# Patient Record
Sex: Female | Born: 1985 | Hispanic: Yes | Marital: Single | Smoking: Never smoker
Health system: Southern US, Community
[De-identification: ages and names within clinical notes are randomized; demographics above are authoritative.]

## PROBLEM LIST (undated history)

## (undated) DIAGNOSIS — K2970 Gastritis, unspecified, without bleeding: Secondary | ICD-10-CM

## (undated) DIAGNOSIS — O24419 Gestational diabetes mellitus in pregnancy, unspecified control: Secondary | ICD-10-CM

## (undated) DIAGNOSIS — D649 Anemia, unspecified: Secondary | ICD-10-CM

## (undated) DIAGNOSIS — R519 Headache, unspecified: Secondary | ICD-10-CM

## (undated) HISTORY — PX: NO PAST SURGERIES: SHX2092

## (undated) HISTORY — DX: Gastritis, unspecified, without bleeding: K29.70

## (undated) HISTORY — DX: Headache, unspecified: R51.9

---

## 2017-04-17 ENCOUNTER — Ambulatory Visit: Payer: Self-pay | Admitting: Family Medicine

## 2017-04-24 ENCOUNTER — Ambulatory Visit: Payer: Self-pay | Admitting: Urgent Care

## 2018-01-08 ENCOUNTER — Emergency Department (HOSPITAL_COMMUNITY): Payer: Self-pay

## 2018-01-08 ENCOUNTER — Emergency Department (HOSPITAL_COMMUNITY)
Admission: EM | Admit: 2018-01-08 | Discharge: 2018-01-08 | Disposition: A | Discharge status: Home / Self Care | Payer: Self-pay | Attending: Emergency Medicine | Admitting: Emergency Medicine

## 2018-01-08 ENCOUNTER — Encounter (HOSPITAL_COMMUNITY): Payer: Self-pay

## 2018-01-08 ENCOUNTER — Other Ambulatory Visit: Payer: Self-pay

## 2018-01-08 DIAGNOSIS — J322 Chronic ethmoidal sinusitis: Secondary | ICD-10-CM

## 2018-01-08 DIAGNOSIS — J0120 Acute ethmoidal sinusitis, unspecified: Secondary | ICD-10-CM | POA: Insufficient documentation

## 2018-01-08 DIAGNOSIS — Z79899 Other long term (current) drug therapy: Secondary | ICD-10-CM | POA: Insufficient documentation

## 2018-01-08 DIAGNOSIS — K219 Gastro-esophageal reflux disease without esophagitis: Secondary | ICD-10-CM | POA: Insufficient documentation

## 2018-01-08 HISTORY — DX: Anemia, unspecified: D64.9

## 2018-01-08 LAB — BASIC METABOLIC PANEL
ANION GAP: 5 (ref 5–15)
BUN: 5 mg/dL — ABNORMAL LOW (ref 6–20)
CO2: 27 mmol/L (ref 22–32)
Calcium: 8.7 mg/dL — ABNORMAL LOW (ref 8.9–10.3)
Chloride: 106 mmol/L (ref 101–111)
Creatinine, Ser: 0.65 mg/dL (ref 0.44–1.00)
GFR calc Af Amer: >60 mL/min (ref >60)
Glucose, Bld: 101 mg/dL — ABNORMAL HIGH (ref 65–99)
POTASSIUM: 3.5 mmol/L (ref 3.5–5.1)
SODIUM: 138 mmol/L (ref 135–145)

## 2018-01-08 LAB — URINALYSIS, ROUTINE W REFLEX MICROSCOPIC
Bilirubin Urine: NEGATIVE
Glucose, UA: NEGATIVE mg/dL
Hgb urine dipstick: NEGATIVE
KETONES UR: NEGATIVE mg/dL
LEUKOCYTES UA: NEGATIVE
NITRITE: NEGATIVE
PH: 8 (ref 5.0–8.0)
PROTEIN: NEGATIVE mg/dL
Specific Gravity, Urine: 1.006 (ref 1.005–1.030)

## 2018-01-08 LAB — I-STAT BETA HCG BLOOD, ED (MC, WL, AP ONLY)

## 2018-01-08 LAB — CBC
HEMATOCRIT: 33.9 % — AB (ref 36.0–46.0)
HEMOGLOBIN: 11.3 g/dL — AB (ref 12.0–15.0)
MCH: 28.9 pg (ref 26.0–34.0)
MCHC: 33.3 g/dL (ref 30.0–36.0)
MCV: 86.7 fL (ref 78.0–100.0)
Platelets: 240 10*3/uL (ref 150–400)
RBC: 3.91 MIL/uL (ref 3.87–5.11)
RDW: 12.9 % (ref 11.5–15.5)
WBC: 4.4 10*3/uL (ref 4.0–10.5)

## 2018-01-08 LAB — CBG MONITORING, ED: GLUCOSE-CAPILLARY: 88 mg/dL (ref 65–99)

## 2018-01-08 MED ORDER — KETOROLAC TROMETHAMINE 15 MG/ML IJ SOLN
15.0000 mg | Freq: Once | INTRAMUSCULAR | Status: AC
  Administered 2018-01-08: 15 mg via INTRAVENOUS
  Filled 2018-01-08: qty 1

## 2018-01-08 MED ORDER — PROCHLORPERAZINE EDISYLATE 10 MG/2ML IJ SOLN
10.0000 mg | Freq: Once | INTRAMUSCULAR | Status: AC
  Administered 2018-01-08: 10 mg via INTRAVENOUS
  Filled 2018-01-08: qty 2

## 2018-01-08 MED ORDER — SODIUM CHLORIDE 0.9 % IV BOLUS
1000.0000 mL | Freq: Once | INTRAVENOUS | Status: AC
  Administered 2018-01-08: 1000 mL via INTRAVENOUS

## 2018-01-08 MED ORDER — DIPHENHYDRAMINE HCL 25 MG PO CAPS
25.0000 mg | ORAL_CAPSULE | Freq: Once | ORAL | Status: AC
  Administered 2018-01-08: 25 mg via ORAL
  Filled 2018-01-08: qty 1

## 2018-01-08 MED ORDER — PREDNISONE 20 MG PO TABS: 40.0000 mg | ORAL_TABLET | Freq: Every day | ORAL | 0 refills | Status: AC

## 2018-01-08 MED ORDER — GI COCKTAIL ~~LOC~~
30.0000 mL | Freq: Once | ORAL | Status: AC
  Administered 2018-01-08: 30 mL via ORAL
  Filled 2018-01-08: qty 30

## 2018-01-08 MED ORDER — AMOXICILLIN-POT CLAVULANATE 875-125 MG PO TABS: 1.0000 | ORAL_TABLET | Freq: Two times a day (BID) | ORAL | 0 refills | Status: DC

## 2018-01-08 NOTE — ED Notes (Signed)
Pt blood pressure 96/62 standing, no complaint of dizziness or lightheaded. Pt walked to bathroom with no problems

## 2018-01-08 NOTE — ED Notes (Signed)
Based on manual bp, Provider advised monitoring bp and delaying discharge.

## 2018-01-08 NOTE — ED Notes (Signed)
ED Provider at bedside. 

## 2018-01-08 NOTE — Discharge Instructions (Addendum)
Gracias por permitirme brindarle atencin hoy en el departamento de emergencias. Llame y programe una cita de seguimiento con su proveedor de atencin primaria para volver a Holiday representativerealizar la prxima semana. Tome 1 tableta de Augmentin 2 veces al Allstateda durante los prximos 7 Aniwadas. Este medicamento es un antibitico y se [place] para tratar infecciones. Tomar 2 comprimidos de prednisona una vez al da durante 4 [address]das. Este medicamento es un corticosteroide y se [place] para disminuir la inflamacin, que puede obtener cuando tiene una infeccin e inflamacin de los senos paranasales. Tome 600 mg de ibuprofeno con alimentos o 650 mg de Tylenol para ayudar con su dolor de [place]. Si la bebida lquida que le proporcionamos ayuda con su dolor abdominal, Maalox est disponible sin receta. Si an tiene problemas para dormir, puede probar la melatonina, que tambin est disponible sin receta. Regrese a la sala de emergencias si pierde la vista, presenta debilidad o adormecimiento, fiebre alta que no mejora con el ibuprofeno o el Tylenol u otros sntomas nuevos relacionados con los sntomas.  Thank you for allowing me to provide your care today in the emergency department.  Please call and schedule follow-up appointment with your primary care provider for a recheck in the next week.  Take 1 tablet of Augmentin 2 times daily for the next 7 days.  This medication is an antibiotic and is used to treat infections.   Take 2 tablets of prednisone once daily for 4 days. This medication is a corticosteroid and is used to decrease inflammation, which you can get when you have infection and inflammation of the sinuses.   Take 600 mg of ibuprofen with food or 650 mg of Tylenol to help with your headache.  If the liquid drink we gave you help your abdominal pain, Maalox is available over-the-counter.  If you are still having problems sleeping you can try melatonin, which is also available over-the-counter.  Return to the emergency  department if you lose your vision, develop weakness or numbness, a high fever that does not improve with ibuprofen or Tylenol, or other new, concerning symptoms.

## 2018-01-08 NOTE — ED Notes (Signed)
PT alert and oriented in NAD. Pt and family verbalized understanding of discharge instructions.

## 2018-01-08 NOTE — ED Notes (Signed)
Pt is pending discharge.  PA Mia and Dr. Rhunette CroftNanavati notified.  MD advised

## 2018-01-08 NOTE — ED Notes (Signed)
Pt reports that she has been having a headache, and epigastric pain with dizziness X 3 months. Pt states that she is eating little and felling nauseated. Denies vomiting denies Diarrhea. States that she was seen at the clinic and given medicaiton for her head but that the pills did not help, she cannot remember the name of the medication.

## 2018-01-08 NOTE — ED Triage Notes (Signed)
Pt reports she has been having fevers, headaches, weakness, and dizziness X3 months. Afebrile in triage. Pt alert and oriented.

## 2018-01-08 NOTE — ED Provider Notes (Signed)
[facility] EMERGENCY DEPARTMENT Provider Note   CSN: [id number] Arrival date & time: 01/08/18  4098     History   Chief Complaint Chief Complaint  Patient presents with  . Headache  . Fever    HPI Theresa Lambert is a 32 y.o. female with no pertinent past medical history who presents to the emergency department with a chief complaint of headache.  She endorses a constant, global headache that began 3 months ago.  She characterizes the pain as pressure and states "it feels like my head is going to explode."  She reports associated dizziness, fever, generalized weakness, intermittent blurred vision, nausea, and emesis.  She reports the dizziness is constant and began with the onset of the headache.  Nausea and NBNB emesis have been intermittent.  Last episode of vomiting was yesterday.  She reports approximately 2 episodes every 3 to 4 days over the last 3 months.  She also endorses waxing and waning fever, but has not checked her temperature at home.  She also endorses nonradiating, intermittent epigastric pain.  She is unable to say when this started.  She denies neck pain or stiffness, dyspnea, chest pain, otalgia, tinnitus, syncope, constipation, diarrhea, dysuria, vaginal pain or discharge, slurred speech, facial droop, numbness, or weakness.  She was seen by her PCP 3 days ago who prescribed her sumatriptan and Fioricet.  She reports that she took 1 tablet of 1 of these medications yesterday, but is unsure which when she took.  No improvement in her symptoms.  No other treatment prior to arrival.  The history is provided by the patient. A language interpreter was used ([place]).  Fever   Associated symptoms include vomiting and headaches. Pertinent negatives include no chest pain, no diarrhea, no congestion and no sore throat.    Past Medical History:  Diagnosis Date  . Anemia     There are no active problems to display for this patient.   OB History     None      Home Medications    Prior to Admission medications   Medication Sig Start Date End Date Taking? Authorizing Provider  Ferrous Sulfate (IRON) 325 (65 Fe) MG TABS Take 325 mg by mouth daily.   Yes [provider]  vitamin C (ASCORBIC ACID) 500 MG tablet Take 500 mg by mouth daily.   Yes [provider]  amoxicillin-clavulanate (AUGMENTIN) 875-125 MG tablet Take 1 tablet by mouth every 12 (twelve) hours. 01/08/18   Iyad Deroo A, PA-C  predniSONE (DELTASONE) 20 MG tablet Take 2 tablets (40 mg total) by mouth daily for 4 days. 01/08/18 01/12/18  Garren Greenman, Pedro Earls A, PA-C    Family History No family history on file.  Social History Social History   Tobacco Use  . Smoking status: Never Smoker  . Smokeless tobacco: Never Used  Substance Use Topics  . Alcohol use: Not Currently  . Drug use: Not on file     Allergies   Patient has no known allergies.   Review of Systems Review of Systems  Constitutional: Positive for fever. Negative for activity change and chills.  HENT: Negative for congestion, drooling, sinus pressure, sneezing, sore throat and voice change.   Eyes: Positive for photophobia and visual disturbance.  Respiratory: Negative for shortness of breath.   Cardiovascular: Negative for chest pain, palpitations and leg swelling.  Gastrointestinal: Positive for nausea and vomiting. Negative for abdominal pain, blood in stool and diarrhea.  Genitourinary: Negative for dysuria and urgency.  Musculoskeletal:  Negative for back pain, neck pain and neck stiffness.  Skin: Negative for rash.  Allergic/Immunologic: Negative for immunocompromised state.  Neurological: Positive for dizziness and headaches. Negative for seizures, syncope, weakness, light-headedness and numbness.  Hematological: Does not bruise/bleed easily.  Psychiatric/Behavioral: Negative for confusion.   Physical Exam Updated Vital Signs BP 96/62   Pulse 76   Temp 99 F (37.2 C)  (Oral)   Resp 18   LMP 12/18/2017 (Within Days)   SpO2 100%   Physical Exam  Constitutional: She is oriented to person, place, and time. No distress.  HENT:  Head: Normocephalic.  Right Ear: Hearing and ear canal normal. No mastoid tenderness. Tympanic membrane is bulging. Tympanic membrane is not erythematous.  Left Ear: Hearing and ear canal normal. No mastoid tenderness. Tympanic membrane is bulging. Tympanic membrane is not erythematous.  Nose: Nose normal. Right sinus exhibits no maxillary sinus tenderness and no frontal sinus tenderness. Left sinus exhibits no maxillary sinus tenderness and no frontal sinus tenderness.  Mouth/Throat: Uvula is midline and oropharynx is clear and moist.  Eyes: Pupils are equal, round, and reactive to light. Conjunctivae and EOM are normal.  Neck: Normal range of motion. Neck supple.  No meningismus.  Cardiovascular: Normal rate, regular rhythm, normal heart sounds and intact distal pulses. Exam reveals no gallop and no friction rub.  No murmur heard. Pulmonary/Chest: Effort normal. No stridor. No respiratory distress. She has no wheezes. She has no rales. She exhibits no tenderness.  Abdominal: Soft. She exhibits no distension. There is tenderness.  Tender to palpation in the epigastric region without rebound or guarding.  Neurological: She is alert and oriented to person, place, and time. She has normal strength.  GCS 15.  Alert and oriented x3.  Cranial nerves II through XII are grossly intact.  Finger-to-nose is intact bilaterally with no dysmetria.  Negative Romberg.  No pronator drift.  Symmetric tandem gait.  Normal ambulation with heel and toe walking.  5 out of 5 strength of the bilateral upper and lower extremities.  No clonus.  Sensation is intact and symmetric throughout.  Skin: Skin is warm. No rash noted. She is not diaphoretic.  Psychiatric: Her behavior is normal.  Nursing note and vitals reviewed.    ED Treatments / Results   Labs (all labs ordered are listed, but only abnormal results are displayed) Labs Reviewed  BASIC METABOLIC PANEL - Abnormal; Notable for the following components:      Result Value   Glucose, Bld 101 (*)    BUN 5 (*)    Calcium 8.7 (*)    All other components within normal limits  CBC - Abnormal; Notable for the following components:   Hemoglobin 11.3 (*)    HCT 33.9 (*)    All other components within normal limits  URINALYSIS, ROUTINE W REFLEX MICROSCOPIC - Abnormal; Notable for the following components:   Color, Urine STRAW (*)    All other components within normal limits  CBG MONITORING, ED  I-STAT BETA HCG BLOOD, ED ([facility], [facility], [facility] ONLY)    EKG EKG Interpretation  Date/Time:  Monday January 08 2018 09:09:14 EDT Ventricular Rate:  69 PR Interval:  122 QRS Duration: 80 QT Interval:  412 QTC Calculation: 441 R Axis:   74 Text Interpretation:  Normal sinus rhythm Nonspecific ST abnormality Abnormal ECG Confirmed by Rolanda Lundborg [phone]) on 01/08/2018 9:39:53 AM Also confirmed by Rolanda Lundborg [phone]), editor Sheppard Evens ([id number])  on 01/08/2018 10:49:20 AM   Radiology Ct Head Wo  Contrast  Result Date: 01/08/2018 CLINICAL DATA:  Headache and dizziness EXAM: CT HEAD WITHOUT CONTRAST TECHNIQUE: Contiguous axial images were obtained from the base of the skull through the vertex without intravenous contrast. COMPARISON:  None. FINDINGS: Brain: The ventricles are normal in size and configuration. The right lateral ventricle is slightly larger than the left lateral ventricle, a likely anatomic variant. Prominence of the cisterna magna is an anatomic variant. There is no mass, hemorrhage, extra-axial fluid collection, or midline shift. Gray-white compartments appear normal. No evident acute infarct. Vascular: No hyperdense vessel. No vascular calcifications are evident. Skull: The bony calvarium appears intact. Sinuses/Orbits: There is mucosal thickening in several ethmoid air cells. Other  visualized paranasal sinuses are clear. Visualized orbits appear symmetric bilaterally. Other: Mastoid air cells are clear. IMPRESSION: Mucosal thickening in several ethmoid air cells. Study otherwise unremarkable. Electronically Signed   By: Bretta BangWilliam  Woodruff III M.D.   On: 01/08/2018 11:24    Procedures Procedures (including critical care time)  Medications Ordered in ED Medications  gi cocktail (Maalox,Lidocaine,Donnatal) (30 mLs Oral Given 01/08/18 1105)  prochlorperazine (COMPAZINE) injection 10 mg (10 mg Intravenous Given 01/08/18 1156)  sodium chloride 0.9 % bolus 1,000 mL (0 mLs Intravenous Stopped 01/08/18 1248)  diphenhydrAMINE (BENADRYL) capsule 25 mg (25 mg Oral Given 01/08/18 1157)  ketorolac (TORADOL) 15 MG/ML injection 15 mg (15 mg Intravenous Given 01/08/18 1156)     Initial Impression / Assessment and Plan / ED Course  I have reviewed the triage vital signs and the nursing notes.  Pertinent labs & imaging results that were available during my care of the patient were reviewed by me and considered in my medical decision making (see chart for details).     32 year old female with no pertinent past medical history who presents to the emergency department with headache, dizziness, nausea, and vomiting, onset 3 months ago.  On exam, the patient has a normal neurologic exam with no focal deficits.  BP 112/65 on arrival.  She is afebrile. Labs are reassuring.    Given chronicity of her symptoms, CT head ordered, which demonstrates mucosal thickening in the bilateral ethmoid air cells.  GI cocktail given for epigastric pain.  The patient reports resolution of abdominal pain on reevaluation.  Migraine cocktail given with IV fluid bolus, Compazine, Benadryl, and Toradol.   Nursing staff reports that the patient's blood pressure after 1 L of IV fluid bolus is now at 88/50.  The patient was discussed with Dr. Rhunette CroftNanavati, attending physician.  On reevaluation, the patient remains  asymptomatic with no complaints of lightheadedness or presyncope.  She is able to ambulate in the emergency department and remains asymptomatic.  She also reports that her headache has significantly improved following migraine cocktail.  Doubt meningitis, neoplasm, migraine, tension headache, or labyrinthitis.  Will treat the patient with Augmentin and prednisone for ethmoid air cell sinusitis.  NSAIDs recommended for pain control.  I have advised the patient to follow-up with her PCP for reevaluation.  Strict return precautions to the ED given.  She is in no acute distress and safe for outpatient follow-up at this time.  Final Clinical Impressions(s) / ED Diagnoses   Final diagnoses:  Ethmoid sinusitis, unspecified chronicity  Gastroesophageal reflux disease without esophagitis    ED Discharge Orders        Ordered    amoxicillin-clavulanate (AUGMENTIN) 875-125 MG tablet  Every 12 hours     01/08/18 1234    predniSONE (DELTASONE) 20 MG tablet  Daily  01/08/18 1235       Crickett Abbett A, PA-C 01/08/18 1715    Derwood Kaplan, MD 01/11/18 1101

## 2020-04-22 ENCOUNTER — Emergency Department (HOSPITAL_COMMUNITY)
Admission: EM | Admit: 2020-04-22 | Discharge: 2020-04-22 | Disposition: A | Discharge status: Home / Self Care | Payer: Self-pay | Attending: Emergency Medicine | Admitting: Emergency Medicine

## 2020-04-22 ENCOUNTER — Other Ambulatory Visit: Payer: Self-pay

## 2020-04-22 ENCOUNTER — Encounter (HOSPITAL_COMMUNITY): Payer: Self-pay

## 2020-04-22 ENCOUNTER — Emergency Department (HOSPITAL_COMMUNITY): Payer: Self-pay

## 2020-04-22 DIAGNOSIS — R102 Pelvic and perineal pain: Secondary | ICD-10-CM

## 2020-04-22 DIAGNOSIS — Z3A12 12 weeks gestation of pregnancy: Secondary | ICD-10-CM | POA: Insufficient documentation

## 2020-04-22 DIAGNOSIS — O26891 Other specified pregnancy related conditions, first trimester: Secondary | ICD-10-CM | POA: Insufficient documentation

## 2020-04-22 DIAGNOSIS — O4690 Antepartum hemorrhage, unspecified, unspecified trimester: Secondary | ICD-10-CM

## 2020-04-22 DIAGNOSIS — O4691 Antepartum hemorrhage, unspecified, first trimester: Secondary | ICD-10-CM | POA: Insufficient documentation

## 2020-04-22 DIAGNOSIS — R1030 Lower abdominal pain, unspecified: Secondary | ICD-10-CM | POA: Insufficient documentation

## 2020-04-22 LAB — ABO/RH: ABO/RH(D): O POS

## 2020-04-22 LAB — URINALYSIS, ROUTINE W REFLEX MICROSCOPIC
Bilirubin Urine: NEGATIVE
Glucose, UA: NEGATIVE mg/dL
Hgb urine dipstick: NEGATIVE
Ketones, ur: NEGATIVE mg/dL
Leukocytes,Ua: NEGATIVE
Nitrite: NEGATIVE
Protein, ur: NEGATIVE mg/dL
Specific Gravity, Urine: 1.004 — ABNORMAL LOW (ref 1.005–1.030)
pH: 8 (ref 5.0–8.0)

## 2020-04-22 LAB — WET PREP, GENITAL
Sperm: NONE SEEN
Trich, Wet Prep: NONE SEEN
Yeast Wet Prep HPF POC: NONE SEEN

## 2020-04-22 LAB — CBC
HCT: 33.9 % — ABNORMAL LOW (ref 36.0–46.0)
Hemoglobin: 12.2 g/dL (ref 12.0–15.0)
MCH: 30.3 pg (ref 26.0–34.0)
MCHC: 36 g/dL (ref 30.0–36.0)
MCV: 84.1 fL (ref 80.0–100.0)
Platelets: 237 10*3/uL (ref 150–400)
RBC: 4.03 MIL/uL (ref 3.87–5.11)
RDW: 12.7 % (ref 11.5–15.5)
WBC: 5.8 10*3/uL (ref 4.0–10.5)
nRBC: 0 % (ref 0.0–0.2)

## 2020-04-22 NOTE — ED Triage Notes (Addendum)
Patient reports she is almost 3 months pregnant and "has been feeling a lot of weight in her stomach for past 7 days".   Patient reports some spotting this morning.  C/O headache   A/Ox4 Ambulatory in triage

## 2020-04-22 NOTE — Discharge Instructions (Addendum)
Do not put anything in your vagina until recheck Return to the ED if you have worsening pain or bleeding- please go to the [facility] maternity admissions They will be calling you with a appointment for follow up for prenatal care.

## 2020-04-22 NOTE — ED Notes (Signed)
Used wallE translation service. Translator Tania [phone]

## 2020-04-22 NOTE — ED Provider Notes (Addendum)
[facility]-EMERGENCY DEPT Provider Note   CSN: [id number] Arrival date & time: 04/22/20  2353     History Chief Complaint  Patient presents with  . Routine Prenatal Visit  . Vaginal Bleeding    Theresa Lambert is a 34 y.o. female.  HPI     Level 5 caveat secondary to language barrier Remote interpreter used for history, physical and patient receiving information 34 year old female G43, P3 LMP July 18 with positive pregnancy test last week and doctor's office presents today complaining of lower pelvic discomfort.  She states it has been present for couple weeks but has worsened over the past couple days.  It is crampy type discomfort.  She has had some spotting today which is what brought her into the ED.  She reports her previous pregnancies were normal with the exception of some similar cramping with one of her pregnancies.  She also has had some abnormal vaginal discharge over the past year.  She reports that she was tested at a doctor's office and was told that she did not have anything wrong.  She reports that her previous pregnancies were in [place].  She denies having any abnormal blood tests or needing any RhoGam shots.  She states that she cannot get back into the clinic that she is being seen for until next month.  She is unclear is whether or not they are pregnant following her for her pregnancy.  Past Medical History:  Diagnosis Date  . Anemia     There are no problems to display for this patient.    The histories are not reviewed yet. Please review them in the "History" navigator section and refresh this SmartLink.   OB History    Gravida  1   Para      Term      Preterm      AB      Living        SAB      TAB      Ectopic      Multiple      Live Births              No family history on file.  Social History   Tobacco Use  . Smoking status: Never Smoker  . Smokeless tobacco: Never Used  Substance Use Topics  .  Alcohol use: Not Currently  . Drug use: Not on file    Home Medications Prior to Admission medications   Medication Sig Start Date End Date Taking? Authorizing Provider  amoxicillin-clavulanate (AUGMENTIN) 875-125 MG tablet Take 1 tablet by mouth every 12 (twelve) hours. 01/08/18   McDonald, Mia A, PA-C  Ferrous Sulfate (IRON) 325 (65 Fe) MG TABS Take 325 mg by mouth daily.    [provider]  vitamin C (ASCORBIC ACID) 500 MG tablet Take 500 mg by mouth daily.    [provider]    Allergies    Patient has no known allergies.  Review of Systems   Review of Systems  All other systems reviewed and are negative.   Physical Exam Updated Vital Signs BP 114/68   Pulse 71   Temp 98.8 F (37.1 C) (Oral)   Resp 16   SpO2 100%   Physical Exam Vitals reviewed. Exam conducted with a chaperone present.  Constitutional:      Appearance: Normal appearance.  HENT:     Head: Normocephalic.     Right Ear: External ear normal.     Left  Ear: External ear normal.     Nose: Nose normal.     Mouth/Throat:     Mouth: Mucous membranes are moist.  Eyes:     Extraocular Movements: Extraocular movements intact.     Pupils: Pupils are equal, round, and reactive to light.  Cardiovascular:     Rate and Rhythm: Normal rate and regular rhythm.     Pulses: Normal pulses.  Pulmonary:     Effort: Pulmonary effort is normal.     Breath sounds: Normal breath sounds.  Abdominal:     General: Abdomen is flat. Bowel sounds are normal.     Palpations: Abdomen is soft.  Genitourinary:    Labia:        Right: No rash, tenderness or lesion.        Left: No rash, tenderness or lesion.      Vagina: Vaginal discharge present.     Uterus: Enlarged.      Adnexa: Right adnexa normal and left adnexa normal.     Comments: Uterus is retroverted Musculoskeletal:     Cervical back: Normal range of motion.  Neurological:     Mental Status: She is alert.     ED Results / Procedures /  Treatments   Labs (all labs ordered are listed, but only abnormal results are displayed) Labs Reviewed  CBC  URINALYSIS, ROUTINE W REFLEX MICROSCOPIC  ABO/RH  GC/CHLAMYDIA PROBE AMP () NOT AT [facility]    EKG None  Radiology No results found.  Procedures Ultrasound ED OB Pelvic  Date/Time: 04/22/2020 9:19 AM Performed by: Margarita Grizzle, MD Authorized by: Margarita Grizzle, MD   Procedure details:    Indications: evaluate for IUP and pregnant with abdominal pain     Assess:  Intrauterine pregnancy   Technique:  Transabdominal obstetric (HCG+) exam   Images: archived    Uterine findings:    Intrauterine pregnancy: identified     Single gestation: identified     Fetal heart rate: identified     Estimated gestational age: 40 weeks Left ovary findings:    Left ovary:  Visualized   Adnexal mass: not identified Right ovary findings:     Right ovary:  Unable to visualize    Other findings:    Free pelvic fluid: not identified     (including critical care time)  Medications Ordered in ED Medications - No data to display  ED Course  I have reviewed the triage vital signs and the nursing notes.  Pertinent labs & imaging results that were available during my care of the patient were reviewed by me and considered in my medical decision making (see chart for details).    MDM Rules/Calculators/A&P                           g4p3 health female with lmp 7/18 presents with some crampy pelvic pain with spotting.   Bedside US with iup Plan labs including gc, wet prep ABO-positive [place] with subchorionic hemmorhage Discussed wet prep results with Dr. Vergie Living.  Patient has had some ongoing symptoms.  Will not treat medically at this point. Discussed with Dr. Vergie Living- plan vaginal bleeding precautions, pelvic rest. OB will call with f/u appointment  Final Clinical Impression(s) / ED Diagnoses Final diagnoses:  Vaginal bleeding in pregnancy    Rx / DC Orders ED Discharge  Orders    None       Margarita Grizzle, MD 04/22/20 1112    Livan Hires,  Duwayne Heck, MD 04/22/20 1113

## 2020-04-23 LAB — GC/CHLAMYDIA PROBE AMP (~~LOC~~) NOT AT ARMC
Chlamydia: NEGATIVE
Comment: NEGATIVE
Comment: NORMAL
Neisseria Gonorrhea: NEGATIVE

## 2020-05-14 ENCOUNTER — Other Ambulatory Visit: Payer: Self-pay | Admitting: Nurse Practitioner

## 2020-05-14 DIAGNOSIS — O09523 Supervision of elderly multigravida, third trimester: Secondary | ICD-10-CM

## 2020-05-14 LAB — OB RESULTS CONSOLE HEPATITIS B SURFACE ANTIGEN: Hepatitis B Surface Ag: NEGATIVE

## 2020-05-14 LAB — GLUCOSE TOLERANCE, 1 HOUR: Glucose, 1 Hour GTT: 120

## 2020-05-14 LAB — OB RESULTS CONSOLE VARICELLA ZOSTER ANTIBODY, IGG: Varicella: IMMUNE

## 2020-05-14 LAB — SICKLE CELL SCREEN: Sickle Cell Screen: NORMAL

## 2020-05-14 LAB — CYSTIC FIBROSIS DIAGNOSTIC STUDY: Interpretation-CFDNA:: NORMAL

## 2020-05-14 LAB — OB RESULTS CONSOLE HGB/HCT, BLOOD
HCT: 35 (ref 29–41)
Hemoglobin: 12

## 2020-05-14 LAB — OB RESULTS CONSOLE ABO/RH: RH Type: POSITIVE

## 2020-05-14 LAB — HEPATITIS C ANTIBODY: HCV Ab: NEGATIVE

## 2020-05-14 LAB — OB RESULTS CONSOLE RPR: RPR: NONREACTIVE

## 2020-05-14 LAB — OB RESULTS CONSOLE ANTIBODY SCREEN: Antibody Screen: NEGATIVE

## 2020-05-14 LAB — OB RESULTS CONSOLE HIV ANTIBODY (ROUTINE TESTING): HIV: NONREACTIVE

## 2020-05-14 LAB — OB RESULTS CONSOLE RUBELLA ANTIBODY, IGM: Rubella: IMMUNE

## 2020-05-14 LAB — OB RESULTS CONSOLE PLATELET COUNT: Platelets: 307

## 2020-05-18 LAB — CYTOLOGY - PAP: Pap: NEGATIVE

## 2020-05-27 ENCOUNTER — Encounter: Payer: Self-pay | Admitting: *Deleted

## 2020-06-01 ENCOUNTER — Ambulatory Visit: Payer: Self-pay

## 2020-06-01 ENCOUNTER — Ambulatory Visit: Payer: Self-pay | Attending: Nurse Practitioner

## 2020-06-08 LAB — OB RESULTS CONSOLE GC/CHLAMYDIA
Chlamydia: NEGATIVE
Gonorrhea: NEGATIVE

## 2020-06-16 ENCOUNTER — Other Ambulatory Visit: Payer: Self-pay

## 2020-06-23 ENCOUNTER — Encounter: Payer: Self-pay | Admitting: *Deleted

## 2020-06-23 ENCOUNTER — Ambulatory Visit: Payer: Self-pay | Admitting: *Deleted

## 2020-06-23 ENCOUNTER — Other Ambulatory Visit: Payer: Self-pay

## 2020-06-23 ENCOUNTER — Other Ambulatory Visit: Payer: Self-pay | Admitting: *Deleted

## 2020-06-23 ENCOUNTER — Ambulatory Visit: Payer: Self-pay | Attending: Obstetrics and Gynecology

## 2020-06-23 VITALS — BP 99/57 | HR 72

## 2020-06-23 DIAGNOSIS — O09523 Supervision of elderly multigravida, third trimester: Secondary | ICD-10-CM | POA: Insufficient documentation

## 2020-06-23 DIAGNOSIS — O09522 Supervision of elderly multigravida, second trimester: Secondary | ICD-10-CM | POA: Insufficient documentation

## 2020-07-01 ENCOUNTER — Other Ambulatory Visit: Payer: Self-pay

## 2020-07-01 ENCOUNTER — Inpatient Hospital Stay (HOSPITAL_COMMUNITY)
Admission: AD | Admit: 2020-07-01 | Discharge: 2020-07-02 | Disposition: A | Discharge status: Home / Self Care | Payer: Self-pay | Attending: Obstetrics & Gynecology | Admitting: Obstetrics & Gynecology

## 2020-07-01 ENCOUNTER — Encounter (HOSPITAL_COMMUNITY): Payer: Self-pay | Admitting: Emergency Medicine

## 2020-07-01 DIAGNOSIS — N898 Other specified noninflammatory disorders of vagina: Secondary | ICD-10-CM

## 2020-07-01 DIAGNOSIS — R1084 Generalized abdominal pain: Secondary | ICD-10-CM

## 2020-07-01 DIAGNOSIS — O26892 Other specified pregnancy related conditions, second trimester: Secondary | ICD-10-CM

## 2020-07-01 DIAGNOSIS — R102 Pelvic and perineal pain: Secondary | ICD-10-CM | POA: Insufficient documentation

## 2020-07-01 DIAGNOSIS — Z3A20 20 weeks gestation of pregnancy: Secondary | ICD-10-CM

## 2020-07-01 DIAGNOSIS — N949 Unspecified condition associated with female genital organs and menstrual cycle: Secondary | ICD-10-CM

## 2020-07-01 DIAGNOSIS — O4290 Premature rupture of membranes, unspecified as to length of time between rupture and onset of labor, unspecified weeks of gestation: Secondary | ICD-10-CM

## 2020-07-01 DIAGNOSIS — O23592 Infection of other part of genital tract in pregnancy, second trimester: Secondary | ICD-10-CM | POA: Insufficient documentation

## 2020-07-01 LAB — CBC WITH DIFFERENTIAL/PLATELET
Abs Immature Granulocytes: 0.03 10*3/uL (ref 0.00–0.07)
Basophils Absolute: 0 10*3/uL (ref 0.0–0.1)
Basophils Relative: 1 %
Eosinophils Absolute: 0.1 10*3/uL (ref 0.0–0.5)
Eosinophils Relative: 1 %
HCT: 30.9 % — ABNORMAL LOW (ref 36.0–46.0)
Hemoglobin: 10.7 g/dL — ABNORMAL LOW (ref 12.0–15.0)
Immature Granulocytes: 0 %
Lymphocytes Relative: 22 %
Lymphs Abs: 1.7 10*3/uL (ref 0.7–4.0)
MCH: 30.7 pg (ref 26.0–34.0)
MCHC: 34.6 g/dL (ref 30.0–36.0)
MCV: 88.8 fL (ref 80.0–100.0)
Monocytes Absolute: 0.4 10*3/uL (ref 0.1–1.0)
Monocytes Relative: 5 %
Neutro Abs: 5.4 10*3/uL (ref 1.7–7.7)
Neutrophils Relative %: 71 %
Platelets: 257 10*3/uL (ref 150–400)
RBC: 3.48 MIL/uL — ABNORMAL LOW (ref 3.87–5.11)
RDW: 14.3 % (ref 11.5–15.5)
WBC: 7.6 10*3/uL (ref 4.0–10.5)
nRBC: 0 % (ref 0.0–0.2)

## 2020-07-01 LAB — URINALYSIS, ROUTINE W REFLEX MICROSCOPIC
Bilirubin Urine: NEGATIVE
Glucose, UA: 50 mg/dL — AB
Hgb urine dipstick: NEGATIVE
Ketones, ur: NEGATIVE mg/dL
Leukocytes,Ua: NEGATIVE
Nitrite: NEGATIVE
Protein, ur: NEGATIVE mg/dL
Specific Gravity, Urine: 1.005 (ref 1.005–1.030)
pH: 8 (ref 5.0–8.0)

## 2020-07-01 LAB — ABO/RH: ABO/RH(D): O POS

## 2020-07-01 LAB — WET PREP, GENITAL
Sperm: NONE SEEN
Trich, Wet Prep: NONE SEEN
Yeast Wet Prep HPF POC: NONE SEEN

## 2020-07-01 LAB — COMPREHENSIVE METABOLIC PANEL
ALT: 17 U/L (ref 0–44)
AST: 19 U/L (ref 15–41)
Albumin: 3.9 g/dL (ref 3.5–5.0)
Alkaline Phosphatase: 49 U/L (ref 38–126)
Anion gap: 7 (ref 5–15)
BUN: 9 mg/dL (ref 6–20)
CO2: 25 mmol/L (ref 22–32)
Calcium: 9 mg/dL (ref 8.9–10.3)
Chloride: 106 mmol/L (ref 98–111)
Creatinine, Ser: 0.53 mg/dL (ref 0.44–1.00)
GFR, Estimated: >60 mL/min (ref >60)
Glucose, Bld: 154 mg/dL — ABNORMAL HIGH (ref 70–99)
Potassium: 3.2 mmol/L — ABNORMAL LOW (ref 3.5–5.1)
Sodium: 138 mmol/L (ref 135–145)
Total Bilirubin: 0.4 mg/dL (ref 0.3–1.2)
Total Protein: 7.1 g/dL (ref 6.5–8.1)

## 2020-07-01 MED ORDER — LACTATED RINGERS IV BOLUS
1000.0000 mL | Freq: Once | INTRAVENOUS | Status: AC
  Administered 2020-07-01: 1000 mL via INTRAVENOUS

## 2020-07-01 MED ORDER — SODIUM CHLORIDE 0.9 % IV BOLUS: 1000.0000 mL | Freq: Once | INTRAVENOUS | Status: DC

## 2020-07-01 MED ORDER — IBUPROFEN 600 MG PO TABS
600.0000 mg | ORAL_TABLET | Freq: Once | ORAL | Status: AC
  Administered 2020-07-02: 600 mg via ORAL
  Filled 2020-07-01: qty 1

## 2020-07-01 NOTE — MAU Provider Note (Signed)
Chief Complaint:  Rupture of Membranes   First Provider Initiated Contact with Patient 07/01/20 2344     HPI: Theresa Lambert is a 34 y.o. G4P3003 at 27w3dwho presents to maternity admissions reporting pain in lower abdomen, mostly on RLQ.  Also c/o leaking fluid with wetness in underwear.  . She reports good fetal movement, denies vaginal bleeding, vaginal itching/burning, urinary symptoms, h/a, dizziness, n/v, diarrhea, constipation or fever/chills.  Vaginal Discharge The patient's primary symptoms include pelvic pain and vaginal discharge. The patient's pertinent negatives include no genital itching, genital lesions, genital odor or vaginal bleeding. This is a new problem. The current episode started today. The problem has been unchanged. Associated symptoms include abdominal pain. Pertinent negatives include no chills, constipation, diarrhea, dysuria, fever, frequency, nausea or vomiting. The vaginal discharge was clear and watery. There has been no bleeding. She has not been passing clots. She has not been passing tissue. Nothing aggravates the symptoms. She has tried nothing for the symptoms.  Abdominal Pain This is a new problem. The current episode started today. The problem occurs constantly. The problem has been unchanged. The pain is located in the RLQ. The quality of the pain is colicky and cramping. The abdominal pain does not radiate. Pertinent negatives include no constipation, diarrhea, dysuria, fever, frequency, nausea or vomiting. The pain is aggravated by palpation. The pain is relieved by nothing. She has tried nothing for the symptoms.       ED Note: Patient is a 34 year old female G4 P3-0-0-3 at approximately [redacted] weeks gestation.  She presents today for evaluation of abdominal discomfort.  Patient states that she has had discomfort throughout this pregnancy, however it has been worsening over the past few days.  She describes a constant pain to the right abdomen.  She denies any  fevers or chills.  She denies any bowel complaints.  She denies any urinary complaints.  Patient does describe clear fluid noted in her underwear.  She denies any bleeding.  She did have an ultrasound performed on 30 November showing a 19-week gestation pregnancy with no complicating features.   Past Medical History: Past Medical History:  Diagnosis Date  . Anemia   . Gastritis   . Headache     Past obstetric history: OB History  Gravida Para Term Preterm AB Living  4 3 3     3   SAB TAB Ectopic Multiple Live Births               # Outcome Date GA Lbr Len/2nd Weight Sex Delivery Anes PTL Lv  4 Current           3 Term           2 Term           1 Term             Past Surgical History: Past Surgical History:  Procedure Laterality Date  . NO PAST SURGERIES      Family History: Family History  Problem Relation Age of Onset  . Asthma Son     Social History: Social History   Tobacco Use  . Smoking status: Never Smoker  . Smokeless tobacco: Never Used  Vaping Use  . Vaping Use: Never used  Substance Use Topics  . Alcohol use: Not Currently  . Drug use: Never    Allergies: No Known Allergies  Meds:  Medications Prior to Admission  Medication Sig Dispense Refill Last Dose  . amoxicillin-clavulanate (AUGMENTIN) 875-125 MG tablet Take 1  tablet by mouth every 12 (twelve) hours. (Patient not taking: Reported on 06/23/2020) 14 tablet 0   . Ferrous Sulfate (IRON) 325 (65 Fe) MG TABS Take 325 mg by mouth daily. (Patient not taking: Reported on 06/23/2020)     . Prenatal Vit-Fe Fumarate-FA (PRENATAL MULTIVITAMIN) TABS tablet Take 1 tablet by mouth daily at 12 noon.     . vitamin C (ASCORBIC ACID) 500 MG tablet Take 500 mg by mouth daily. (Patient not taking: Reported on 06/23/2020)       I have reviewed patient's Past Medical Hx, Surgical Hx, Family Hx, Social Hx, medications and allergies.   ROS:  Review of Systems  Constitutional: Negative for chills and fever.   Gastrointestinal: Positive for abdominal pain. Negative for constipation, diarrhea, nausea and vomiting.  Genitourinary: Positive for pelvic pain and vaginal discharge. Negative for dysuria and frequency.   Other systems negative  Physical Exam   Patient Vitals for the past 24 hrs:  BP Temp Temp src Pulse Resp SpO2 Height Weight  07/01/20 2318 105/64 98.8 F (37.1 C) Oral 67 17 100 % -- --  07/01/20 2215 113/71 -- -- 81 18 99 % -- --  07/01/20 2200 101/67 -- -- 69 18 99 % -- --  07/01/20 2156 -- -- -- -- 18 -- -- --  07/01/20 2145 103/62 -- -- 77 15 99 % -- --  07/01/20 2130 110/72 -- -- 89 16 100 % -- --  07/01/20 2122 108/63 97.9 F (36.6 C) Oral 78 18 99 % 5\' 2"  (1.575 m) 59 kg   Constitutional: Well-developed, well-nourished female in no acute distress.  Cardiovascular: normal rate and rhythm Respiratory: normal effort, clear to auscultation bilaterally GI: Abd soft, non-tender, gravid appropriate for gestational age.   No rebound or guarding. MS: Extremities nontender, no edema, normal ROM Neurologic: Alert and oriented x 4.  GU: Neg CVAT.  PELVIC EXAM: Cervix pink, visually closed, without lesion, small yellowish-white creamy discharge, vaginal walls and external genitalia normal   No pooling. Bimanual exam: Cervix firm, posterior, neg CMT, uterus nontender, Fundal Height consistent with dates, adnexa without tenderness, enlargement, or mass  Dilation: Closed/Long/high   FHT:  140   Labs: Results for orders placed or performed during the hospital encounter of 07/01/20 (from the past 24 hour(s))  Comprehensive metabolic panel     Status: Abnormal   Collection Time: 07/01/20  9:58 PM  Result Value Ref Range   Sodium 138 135 - 145 mmol/L   Potassium 3.2 (L) 3.5 - 5.1 mmol/L   Chloride 106 98 - 111 mmol/L   CO2 25 22 - 32 mmol/L   Glucose, Bld 154 (H) 70 - 99 mg/dL   BUN 9 6 - 20 mg/dL   Creatinine, Ser 14/08/21 0.44 - 1.00 mg/dL   Calcium 9.0 8.9 - 4.16 mg/dL   Total  Protein 7.1 6.5 - 8.1 g/dL   Albumin 3.9 3.5 - 5.0 g/dL   AST 19 15 - 41 U/L   ALT 17 0 - 44 U/L   Alkaline Phosphatase 49 38 - 126 U/L   Total Bilirubin 0.4 0.3 - 1.2 mg/dL   GFR, Estimated 60.6 >30 mL/min   Anion gap 7 5 - 15  CBC with Differential     Status: Abnormal   Collection Time: 07/01/20  9:58 PM  Result Value Ref Range   WBC 7.6 4.0 - 10.5 K/uL   RBC 3.48 (L) 3.87 - 5.11 MIL/uL   Hemoglobin 10.7 (L) 12.0 - 15.0  g/dL   HCT 42.5 (L) 36 - 46 %   MCV 88.8 80.0 - 100.0 fL   MCH 30.7 26.0 - 34.0 pg   MCHC 34.6 30.0 - 36.0 g/dL   RDW 95.6 38.7 - 56.4 %   Platelets 257 150 - 400 K/uL   nRBC 0.0 0.0 - 0.2 %   Neutrophils Relative % 71 %   Neutro Abs 5.4 1.7 - 7.7 K/uL   Lymphocytes Relative 22 %   Lymphs Abs 1.7 0.7 - 4.0 K/uL   Monocytes Relative 5 %   Monocytes Absolute 0.4 0.1 - 1.0 K/uL   Eosinophils Relative 1 %   Eosinophils Absolute 0.1 0.0 - 0.5 K/uL   Basophils Relative 1 %   Basophils Absolute 0.0 0.0 - 0.1 K/uL   Immature Granulocytes 0 %   Abs Immature Granulocytes 0.03 0.00 - 0.07 K/uL  ABO/Rh     Status: None   Collection Time: 07/01/20  9:58 PM  Result Value Ref Range   ABO/RH(D)      O POS Performed at [facility], [address] [place], [place] [address]   Urinalysis, Routine w reflex microscopic     Status: Abnormal   Collection Time: 07/01/20 11:23 PM  Result Value Ref Range   Color, Urine STRAW (A) YELLOW   APPearance CLEAR CLEAR   Specific Gravity, Urine 1.005 1.005 - 1.030   pH 8.0 5.0 - 8.0   Glucose, UA 50 (A) NEGATIVE mg/dL   Hgb urine dipstick NEGATIVE NEGATIVE   Bilirubin Urine NEGATIVE NEGATIVE   Ketones, ur NEGATIVE NEGATIVE mg/dL   Protein, ur NEGATIVE NEGATIVE mg/dL   Nitrite NEGATIVE NEGATIVE   Leukocytes,Ua NEGATIVE NEGATIVE  Wet prep, genital     Status: Abnormal   Collection Time: 07/01/20 11:24 PM  Result Value Ref Range   Yeast Wet Prep HPF POC NONE SEEN NONE SEEN   Trich, Wet Prep NONE SEEN NONE  SEEN   Clue Cells Wet Prep HPF POC PRESENT (A) NONE SEEN   WBC, Wet Prep HPF POC MANY (A) NONE SEEN   Sperm NONE SEEN    --/--/O POS Performed at [facility], [address] [place], [place] [address]  (12/08 2158)  Imaging:    MAU Course/MDM: I have ordered labs and reviewed results. These are normal  Exam is not consistent with PPROM.   WIll get [place] to confirm normal AFI. Suspect pain is probably round ligament pain.  Treatments in MAU included ibuprofen which relieved pain a little bit. .    Assessment: 1. Generalized abdominal pain   2. [redacted] weeks gestation of pregnancy   3.     Probable round ligament pain 4.      Physiologic vaginal discharge  Plan: Discharge home Preterm Labor precautions and fetal kick counts Follow up in Office for prenatal visits  May need pregnancy support belt.  Pt stable at time of discharge.  Wynelle Bourgeois CNM, MSN Certified Nurse-Midwife 07/01/2020 11:44 PM

## 2020-07-01 NOTE — ED Notes (Signed)
[facility] called for transport to MAU.  

## 2020-07-01 NOTE — Progress Notes (Signed)
Pt. G4P3 [redacted]w[redacted]d. Dr. Judd Lien EDP at bedside. Video interpretor utilized for assessment. Doppler just under the umbilicus 140-145bpm. Pt. States she is ctx every couple minutes. Belly soft to palpation during. Also states she has watery discharge and thinks her water is broken. Pt. States no issues with this pregnancy or previous pregnancies-all term vaginal deliveries. Pt. Seen by MFM for SGA. Pt. Receiving LR fluid bolus at this time. Labs sent by RN.   OB Attending and EDP agree patient needs to be transported to MAU by [facility] at this time to rule out rupture and labor.   Patient notified.

## 2020-07-01 NOTE — ED Triage Notes (Signed)
Patient complains of severe abdominal and vaginal pain. Vaginal pain can be so intense it makes it difficult for her to sleep. Patient is [redacted] weeks pregnant. She also reports feeling that there is "water coming down, my pants are a little wet."

## 2020-07-01 NOTE — MAU Note (Addendum)
Transferred from [facility] via [facility] to r/o PPROM. Pt c/o of leaking of clear fluid since 1500.

## 2020-07-01 NOTE — ED Provider Notes (Signed)
[facility]-EMERGENCY DEPT Provider Note   CSN: [id number] Arrival date & time: 07/01/20  2114     History No chief complaint on file.   Ridhi Hoffert is a 34 y.o. female.  Patient is a 34 year old female G4 P3-0-0-3 at approximately [redacted] weeks gestation.  She presents today for evaluation of abdominal discomfort.  Patient states that she has had discomfort throughout this pregnancy, however it has been worsening over the past few days.  She describes a constant pain to the right abdomen.  She denies any fevers or chills.  She denies any bowel complaints.  She denies any urinary complaints.  Patient does describe clear fluid noted in her underwear.  She denies any bleeding.  She did have an ultrasound performed on 30 November showing a 19-week gestation pregnancy with no complicating features.  Also of note is that patient does not speak [place], only [place].  History taken with use of the translator tablet.  The history is provided by the patient.       Past Medical History:  Diagnosis Date  . Anemia   . Gastritis   . Headache     There are no problems to display for this patient.   Past Surgical History:  Procedure Laterality Date  . NO PAST SURGERIES       OB History    Gravida  4   Para  3   Term  3   Preterm      AB      Living  3     SAB      TAB      Ectopic      Multiple      Live Births              Family History  Problem Relation Age of Onset  . Asthma Son     Social History   Tobacco Use  . Smoking status: Never Smoker  . Smokeless tobacco: Never Used  Vaping Use  . Vaping Use: Never used  Substance Use Topics  . Alcohol use: Not Currently  . Drug use: Never    Home Medications Prior to Admission medications   Medication Sig Start Date End Date Taking? Authorizing Provider  amoxicillin-clavulanate (AUGMENTIN) 875-125 MG tablet Take 1 tablet by mouth every 12 (twelve) hours. Patient not taking:  Reported on 06/23/2020 01/08/18   McDonald, Mia A, PA-C  Ferrous Sulfate (IRON) 325 (65 Fe) MG TABS Take 325 mg by mouth daily. Patient not taking: Reported on 06/23/2020    [provider]  Prenatal Vit-Fe Fumarate-FA (PRENATAL MULTIVITAMIN) TABS tablet Take 1 tablet by mouth daily at 12 noon.    [provider]  vitamin C (ASCORBIC ACID) 500 MG tablet Take 500 mg by mouth daily. Patient not taking: Reported on 06/23/2020    [provider]    Allergies    Patient has no known allergies.  Review of Systems   Review of Systems  All other systems reviewed and are negative.   Physical Exam Updated Vital Signs BP 108/63 (BP Location: Left Arm)   Pulse 78   Temp 97.9 F (36.6 C) (Oral)   Resp 18   Ht 5\' 2"  (1.575 m)   Wt 59 kg   LMP 02/09/2020 (Exact Date)   SpO2 99%   BMI 23.78 kg/m   Physical Exam Vitals and nursing note reviewed.  Constitutional:      General: She is not in acute distress.  Appearance: She is well-developed. She is not diaphoretic.  HENT:     Head: Normocephalic and atraumatic.  Cardiovascular:     Rate and Rhythm: Normal rate and regular rhythm.     Heart sounds: No murmur heard.  No friction rub. No gallop.   Pulmonary:     Effort: Pulmonary effort is normal. No respiratory distress.     Breath sounds: Normal breath sounds. No wheezing.  Abdominal:     General: Bowel sounds are normal. There is no distension.     Palpations: Abdomen is soft.     Tenderness: There is no abdominal tenderness.     Comments: Patient with gravid uterus consistent with stated gestational age.  There is mild generalized tenderness to palpation, but no rebound or guarding.  Musculoskeletal:        General: Normal range of motion.     Cervical back: Normal range of motion and neck supple.  Skin:    General: Skin is warm and dry.  Neurological:     Mental Status: She is alert and oriented to person, place, and time.     ED Results /  Procedures / Treatments   Labs (all labs ordered are listed, but only abnormal results are displayed) Labs Reviewed  COMPREHENSIVE METABOLIC PANEL  CBC WITH DIFFERENTIAL/PLATELET  URINALYSIS, ROUTINE W REFLEX MICROSCOPIC  ABO/RH    EKG None  Radiology No results found.  Procedures Procedures (including critical care time)  Medications Ordered in ED Medications  sodium chloride 0.9 % bolus 1,000 mL (has no administration in time range)    ED Course  I have reviewed the triage vital signs and the nursing notes.  Pertinent labs & imaging results that were available during my care of the patient were reviewed by me and considered in my medical decision making (see chart for details).    MDM Rules/Calculators/A&P  Patient is a 34 year old female G4 P3-0-0-3 at [redacted] weeks gestation presenting with complaints of abdominal pain and vaginal leakage.  Patient's abdominal exam is benign.  She was placed on the fetal monitor and fetal heart rate in the 140s.  No contractions visible.  As there is concern for possible membrane rupture, patient will be transferred to the [facility] for [facility] evaluation.  She was seen by rapid response OB here in the ED.   Final Clinical Impression(s) / ED Diagnoses Final diagnoses:  None    Rx / DC Orders ED Discharge Orders    None       Geoffery Lyons, MD 07/01/20 2210

## 2020-07-02 ENCOUNTER — Inpatient Hospital Stay (HOSPITAL_BASED_OUTPATIENT_CLINIC_OR_DEPARTMENT_OTHER): Payer: Self-pay

## 2020-07-02 DIAGNOSIS — R102 Pelvic and perineal pain: Secondary | ICD-10-CM

## 2020-07-02 DIAGNOSIS — R1084 Generalized abdominal pain: Secondary | ICD-10-CM

## 2020-07-02 DIAGNOSIS — Z3A20 20 weeks gestation of pregnancy: Secondary | ICD-10-CM

## 2020-07-02 DIAGNOSIS — O26892 Other specified pregnancy related conditions, second trimester: Secondary | ICD-10-CM

## 2020-07-02 DIAGNOSIS — O42912 Preterm premature rupture of membranes, unspecified as to length of time between rupture and onset of labor, second trimester: Secondary | ICD-10-CM

## 2020-07-25 NOTE — L&D Delivery Note (Signed)
OB/GYN Faculty Practice Delivery Note  Theresa Lambert is a 35 y.o. G2E3662 s/p SDV at [redacted]w[redacted]d. She was admitted for IOl for severe FGR.   ROM: 1h 51m with clear fluid GBS Status:  Negative/-- (03/24 0958) Maximum Maternal Temperature: 98.5  Labor Progress: . Initial SVE: 1cm. She received foley balloon and pitocin. She was AROM'd and she then progressed to complete.   Delivery Date/Time: 4/4 at 0003 Delivery: Called to room and patient was complete and pushing. Head delivered LOA. No nuchal cord present. Shoulder and body delivered in usual fashion. Infant with spontaneous cry, placed on mother's abdomen, dried and stimulated. Cord clamped x 2 after 1-minute delay, and cut by FOB. Baby subsequently handed off to awaiting NICU team. Cord blood drawn. Placenta delivered spontaneously with gentle cord traction. Fundus firm with massage and Pitocin. Labia, perineum, vagina, and cervix inspected inspected with second degree laceration, repaired.  Baby Weight: pending  Placenta: Sent to L&D Complications: None Lacerations: second degree, repaired EBL: 75cc mL Analgesia: local lidocaine    Infant:  APGAR (1 MIN): 9   APGAR (5 MINS): 9   APGAR (10 MINS):     Casper Harrison, MD [facility] Fellow, [facility] for [facility], [facility] 10/26/2020, 3:08 AM

## 2020-08-24 ENCOUNTER — Other Ambulatory Visit: Payer: Self-pay | Admitting: Maternal & Fetal Medicine

## 2020-08-24 ENCOUNTER — Ambulatory Visit: Payer: Self-pay | Attending: Obstetrics and Gynecology

## 2020-08-24 ENCOUNTER — Ambulatory Visit: Payer: Self-pay | Admitting: *Deleted

## 2020-08-24 ENCOUNTER — Ambulatory Visit (HOSPITAL_BASED_OUTPATIENT_CLINIC_OR_DEPARTMENT_OTHER): Payer: Self-pay | Admitting: *Deleted

## 2020-08-24 ENCOUNTER — Other Ambulatory Visit: Payer: Self-pay

## 2020-08-24 ENCOUNTER — Encounter: Payer: Self-pay | Admitting: *Deleted

## 2020-08-24 DIAGNOSIS — O365930 Maternal care for other known or suspected poor fetal growth, third trimester, not applicable or unspecified: Secondary | ICD-10-CM | POA: Insufficient documentation

## 2020-08-24 DIAGNOSIS — Z3A28 28 weeks gestation of pregnancy: Secondary | ICD-10-CM | POA: Insufficient documentation

## 2020-08-24 DIAGNOSIS — Z362 Encounter for other antenatal screening follow-up: Secondary | ICD-10-CM

## 2020-08-24 DIAGNOSIS — O09523 Supervision of elderly multigravida, third trimester: Secondary | ICD-10-CM

## 2020-08-24 NOTE — Procedures (Signed)
Theresa Lambert 1986/01/11 [redacted]w[redacted]d  Fetus A Non-Stress Test Interpretation for 08/24/20  Indication: IUGR  Fetal Heart Rate A Mode: External Baseline Rate (A): 140 bpm Variability: Moderate Accelerations: 10 x 10 Decelerations: None Multiple birth?: No  Uterine Activity Mode: Palpation,Toco Contraction Frequency (min): Occas Contraction Quality: Mild Resting Tone Palpated: Relaxed Resting Time: Adequate  Interpretation (Fetal Testing) Nonstress Test Interpretation: Reactive Overall Impression: Reassuring for gestational age Comments: Dr. Parke Poisson reviewed tracing.

## 2020-08-25 ENCOUNTER — Other Ambulatory Visit: Payer: Self-pay | Admitting: *Deleted

## 2020-08-25 DIAGNOSIS — O365930 Maternal care for other known or suspected poor fetal growth, third trimester, not applicable or unspecified: Secondary | ICD-10-CM

## 2020-08-26 LAB — OB RESULTS CONSOLE RPR: RPR: NONREACTIVE

## 2020-08-26 LAB — OB RESULTS CONSOLE HGB/HCT, BLOOD
HCT: 30 (ref 29–41)
Hemoglobin: 10.4

## 2020-08-26 LAB — GLUCOSE TOLERANCE, 1 HOUR: Glucose, 1 Hour GTT: 88

## 2020-08-31 ENCOUNTER — Ambulatory Visit: Payer: Self-pay | Attending: Obstetrics

## 2020-08-31 ENCOUNTER — Telehealth: Payer: Self-pay

## 2020-08-31 ENCOUNTER — Encounter: Payer: Self-pay | Admitting: *Deleted

## 2020-08-31 ENCOUNTER — Ambulatory Visit: Payer: Self-pay | Admitting: *Deleted

## 2020-08-31 ENCOUNTER — Other Ambulatory Visit: Payer: Self-pay

## 2020-08-31 DIAGNOSIS — O365930 Maternal care for other known or suspected poor fetal growth, third trimester, not applicable or unspecified: Secondary | ICD-10-CM

## 2020-08-31 NOTE — Procedures (Signed)
Theresa Lambert 13-Dec-1985 [redacted]w[redacted]d  Fetus A Non-Stress Test Interpretation for 08/31/20  Indication: IUGR  Fetal Heart Rate A Mode: External Baseline Rate (A): 140 bpm Variability: Moderate Accelerations: 15 x 15 Decelerations: None Multiple birth?: No  Uterine Activity Mode: Palpation,Toco Contraction Frequency (min): none Resting Tone Palpated: Relaxed Resting Time: Adequate  Interpretation (Fetal Testing) Nonstress Test Interpretation: Reactive Overall Impression: Reassuring for gestational age Comments: Dr. Judeth Cornfield reviewed tracing.

## 2020-08-31 NOTE — Telephone Encounter (Signed)
Created GFE's for all the patient's upcoming appointments. Sending them out via mail.

## 2020-09-03 ENCOUNTER — Encounter: Payer: Self-pay | Admitting: General Practice

## 2020-09-07 ENCOUNTER — Ambulatory Visit: Payer: Self-pay | Admitting: *Deleted

## 2020-09-07 ENCOUNTER — Ambulatory Visit: Payer: Self-pay | Attending: Obstetrics

## 2020-09-07 ENCOUNTER — Other Ambulatory Visit: Payer: Self-pay | Admitting: *Deleted

## 2020-09-07 ENCOUNTER — Telehealth: Payer: Self-pay

## 2020-09-07 ENCOUNTER — Ambulatory Visit (HOSPITAL_BASED_OUTPATIENT_CLINIC_OR_DEPARTMENT_OTHER): Payer: Self-pay | Admitting: *Deleted

## 2020-09-07 ENCOUNTER — Encounter: Payer: Self-pay | Admitting: Obstetrics and Gynecology

## 2020-09-07 ENCOUNTER — Ambulatory Visit (INDEPENDENT_AMBULATORY_CARE_PROVIDER_SITE_OTHER): Payer: Self-pay | Admitting: Obstetrics and Gynecology

## 2020-09-07 ENCOUNTER — Other Ambulatory Visit: Payer: Self-pay

## 2020-09-07 ENCOUNTER — Encounter: Payer: Self-pay | Admitting: *Deleted

## 2020-09-07 DIAGNOSIS — Z3A30 30 weeks gestation of pregnancy: Secondary | ICD-10-CM | POA: Insufficient documentation

## 2020-09-07 DIAGNOSIS — O365930 Maternal care for other known or suspected poor fetal growth, third trimester, not applicable or unspecified: Secondary | ICD-10-CM

## 2020-09-07 DIAGNOSIS — O365990 Maternal care for other known or suspected poor fetal growth, unspecified trimester, not applicable or unspecified: Secondary | ICD-10-CM | POA: Insufficient documentation

## 2020-09-07 DIAGNOSIS — O365931 Maternal care for other known or suspected poor fetal growth, third trimester, fetus 1: Secondary | ICD-10-CM

## 2020-09-07 DIAGNOSIS — O0990 Supervision of high risk pregnancy, unspecified, unspecified trimester: Secondary | ICD-10-CM | POA: Insufficient documentation

## 2020-09-07 DIAGNOSIS — O09523 Supervision of elderly multigravida, third trimester: Secondary | ICD-10-CM

## 2020-09-07 NOTE — Telephone Encounter (Signed)
Created GFE for upcoming appts and sending out via mail.

## 2020-09-07 NOTE — Progress Notes (Signed)
Subjective:  Theresa Lambert is a 35 y.o. G4P3003 at [redacted]w[redacted]d being seen today for ongoing prenatal care. Transferred from [facility] d/t to IUGR. TSVD x 3 without problems in [place].  She is currently monitored for the following issues for this high-risk pregnancy and has Supervision of high risk pregnancy, antepartum and IUGR (intrauterine growth restriction) affecting care of mother on their problem list.  Patient reports no complaints.  Contractions: Not present. Vag. Bleeding: None.  Movement: Present. Denies leaking of fluid.   The following portions of the patient's history were reviewed and updated as appropriate: allergies, current medications, past family history, past medical history, past social history, past surgical history and problem list. Problem list updated.  Objective:   Vitals:   09/07/20 1420  BP: 114/75  Pulse: 80  Weight: 136 lb 11.2 oz (62 kg)    Fetal Status: Fetal Heart Rate (bpm): 153   Movement: Present     General:  Alert, oriented and cooperative. Patient is in no acute distress.  Skin: Skin is warm and dry. No rash noted.   Cardiovascular: Normal heart rate noted  Respiratory: Normal respiratory effort, no problems with respiration noted  Abdomen: Soft, gravid, appropriate for gestational age. Pain/Pressure: Present     Pelvic:  Cervical exam deferred        Extremities: Normal range of motion.  Edema: None  Mental Status: Normal mood and affect. Normal behavior. Normal judgment and thought content.   Urinalysis:      Assessment and Plan:  Pregnancy: G4P3003 at [redacted]w[redacted]d  1. Supervision of high risk pregnancy, antepartum Stable. Prenatal labs and vaccines up to date per [facility] records. Uncertain about contraception  2. Poor fetal growth affecting management of mother in third trimester, single or unspecified fetus U/S 08/24/20 < 1 % EFW. Weekly antenatal testing with UA dopplers per MFM and serial growth scans. BPP today 8/8, elevated dopplers by no signs of  absent or reverse flow. Discussed with pt importance of keeping U/S appts. IOL will be based on U/S results.  Live Interrupter used during today's visit  Preterm labor symptoms and general obstetric precautions including but not limited to vaginal bleeding, contractions, leaking of fluid and fetal movement were reviewed in detail with the patient. Please refer to After Visit Summary for other counseling recommendations.  Return in about 2 weeks (around 09/21/2020) for OB visit, face to face, MD only.   Hermina Staggers, MD

## 2020-09-07 NOTE — Patient Instructions (Signed)
Tercer trimestre de embarazo Third Trimester of Pregnancy  El tercer trimestre de embarazo va desde la semana 28 hasta la semana 40. Tambin se dice que va desde el mes 7 hasta el mes 9. En este trimestre, el beb en gestacin (feto) crece muy rpidamente. Hacia el final del noveno mes, el beb en gestacin mide alrededor de 20pulgadas (45cm) de largo. Pesa entre 6y 10libras (2,70y 4,50kg). Cambios en el cuerpo durante el tercer trimestre Su organismo contina atravesando por muchos cambios durante este perodo. Los cambios varan y generalmente vuelven a la normalidad despus del nacimiento del beb. Cambios fsicos  Seguir aumentando de peso. Puede ser que aumente entre 25 y 35libras (11 y 16kg) hacia el final del embarazo. Si tiene bajo peso, puede aumentar entre 28 y 40lb (unos 13 a 18kg). Si tiene sobrepeso, puede aumentar entre 15 y 25 libras (unos 7 a 11kg).  Podrn aparecer las primeras estras en las caderas, el vientre (abdomen) y las mamas.  Las mamas seguirn creciendo y pueden doler. Un lquido amarillo (calostro) puede salir de sus pechos. Esta es la primera leche que usted produce para el beb.  Tal vez haya cambios en el cabello.  El ombligo puede salir hacia afuera.  Puede observar que se le hinchan ms las manos, la cara o los tobillos. Cambios en la salud  Es posible que tenga acidez estomacal.  Es posible que tenga dificultades para defecar (estreimiento).  Pueden aparecerle hemorroides. Estas son venas hinchadas en el ano que pueden picar o doler.  Puede comenzar a tener venas hinchadas (vrices) en las piernas.  Puede presentar ms dolor en la pelvis, la espalda o los muslos.  Puede presentar ms hormigueo o entumecimiento en las manos, los brazos y las piernas. La piel de su vientre tambin puede sentirse entumecida.  Es posible que sienta falta de aire a medida que el tero se agranda. Otros cambios  Es posible que haga pis (orine) con mayor  frecuencia.  Puede tener ms problemas para dormir.  Puede notar que el beb en gestacin "baja" o se mueve ms hacia bajo, en el vientre.  Puede notar ms secrecin proveniente de la vagina.  Puede sentir las articulaciones flojas y puede sentir dolor alrededor del hueso plvico. Siga estas instrucciones en su casa: Medicamentos  Use los medicamentos de venta libre y los recetados solamente como se lo haya indicado el mdico. Algunos medicamentos no son seguros durante el embarazo.  Tome vitaminas prenatales que contengan por lo menos 600microgramos (mcg) de cido flico. Comida y bebida  Consuma comidas saludables que incluyan lo siguiente: ? Frutas y verduras frescas. ? Cereales integrales. ? Buenas fuentes de protenas, como carne, huevos y tofu. ? Productos lcteos con bajo contenido de grasa.  Evite la carne cruda y el jugo, la leche y el queso sin pasteurizar. Estos portan grmenes que pueden provocar dao tanto a usted como al beb.  Tome 4 o 5 comidas pequeas en lugar de 3 comidas abundantes al da.  Es posible que deba tomar medidas para prevenir o tratar los problemas para defecar: ? Beber suficiente lquido para mantener el pis (orina) de color amarillo plido. ? Come alimentos ricos en fibra. Entre ellos, frijoles, cereales integrales y frutas y verduras frescas. ? Limitar los alimentos con alto contenido de grasa y azcar. Estos incluyen alimentos fritos o dulces. Actividad  Haga ejercicios solamente como se lo haya indicado el mdico. Interrumpa la actividad fsica si comienza a tener clicos en el tero.  Evite   levantar pesos [employer].  No haga ejercicio si hace demasiado calor, hay demasiada humedad o se encuentra en un lugar de mucha altura (altitud elevada).  Si lo desea, puede continuar teniendo [employer], a menos que el mdico le indique lo contrario. Alivio del dolor y del malestar  Haga pausas con frecuencia y descanse con las piernas  levantadas (elevadas) si tiene calambres en las piernas o dolor en la parte baja de la espalda.  Dese baos de asiento con agua tibia para Best boy o las molestias causadas por las hemorroides. Use una crema para las hemorroides si el mdico la autoriza.  Use un sostn que le brinde buen soporte si sus mamas estn sensibles.  Si desarrolla venas hinchadas y abultadas en las piernas: ? Use medias de compresin segn las indicaciones de su mdico. ? Levante los pies durante 24minutos, 3 o 4veces por Training and development officer. ? Limite la sal en sus alimentos. Seguridad  Hable con el mdico antes de Control and instrumentation engineer.  No se d baos de inmersin en agua caliente, baos turcos ni saunas.  Use el cinturn de seguridad en todo momento mientras vaya en auto.  Hable con el mdico si alguien le est haciendo dao o gritando [place]. Preparacin para la llegada del beb Para prepararse para la llegada de su beb:  Tome clases prenatales.  Visite el hospital y recorra el rea de maternidad.  Compre un asiento de seguridad [employer] atrs para llevar al beb en el automvil. Aprenda cmo instalarlo en el auto.  Prepare la habitacin del beb. Saque todas las almohadas y los animales de peluche de la cuna del beb. Instrucciones generales  Evite el contacto con las bandejas sanitarias de los gatos y la tierra que estos animales usan. Estos contienen grmenes que pueden daar al beb y causar la prdida del beb ya sea aborto espontneo o muerte fetal.  No se haga duchas vaginales ni use tampones. No use tampones ni toallas higinicas perfumadas.  No fume ni consuma ningn producto que contenga nicotina o tabaco. Si necesita ayuda para dejar de fumar, consulte al mdico.  No beba alcohol.  No use medicamentos a base de hierbas, drogas ilegales, ni medicamentos que el mdico no haya autorizado. Las sustancias qumicas de estos productos pueden afectar al beb.  Cumpla con todas las  visitas de seguimiento. Esto es importante. Dnde buscar ms informacin  American Pregnancy Association (Asociacin Americana del Embarazo): americanpregnancy.org  [employer] of Obstetricians and Gynecologists (Colegio Estadounidense de Obstetras y Gineclogos): www.acog.org  Office on [employer] ([place]): [email or web address] Comunquese con un mdico si:  Tiene fiebre.  Tiene clicos leves o siente presin en la parte baja del vientre.  Sufre un dolor persistente en el abdomen.  Vomita o hace deposiciones acuosas (diarrea).  Advierte lquido con mal olor que proviene de la vagina.  Siente dolor al orinar o hace orina con mal olor.  Tiene un dolor de cabeza que no desaparece despus de Teacher, adult education.  Nota cambios en la visin o ve manchas delante de los ojos. Solicite ayuda de inmediato si:  Rompe la bolsa.  Tiene contracciones regulares separadas por menos de 54minutos.  Tiene sangrado o pequeas prdidas vaginales.  Tiene clicos o dolor muy intensos en el vientre.  Tiene dificultad para respirar.  Sientes dolor en el pecho.  Se desmaya.  No ha sentido al beb moverse durante el tiempo que le indic el mdico.  Tiene dolor, hinchazn o enrojecimiento nuevos  en un brazo o una pierna o se produce un aumento de alguno de estos sntomas. Resumen  El tercer trimestre comprende desde la [employer] la semana40 (desde el mes7 hasta el mes9). Esta es la poca en que el beb en gestacin crece muy rpidamente.  Durante este perodo, las [address] a medida que usted sube de peso y el beb crece.  Preprese para la llegada del beb: asista a las clases prenatales, compre un asiento de seguridad orientado hacia atrs para llevar al beb en auto y prepare la habitacin del beb.  Solicite ayuda de inmediato si tiene sangrado por la vagina, siente dolor en el pecho y tiene dificultad para respirar, o si  no ha sentido al beb moverse durante el tiempo que le indic el mdico. Esta informacin no tiene Theme park manager el consejo del mdico. Asegrese de hacerle al mdico cualquier pregunta que tenga. Document Revised: 01/22/2020 Document Reviewed: 01/22/2020 Elsevier Patient Education  2021 Elsevier Inc. Restriccin del crecimiento fetal Fetal Growth Restriction  La restriccin del crecimiento fetal, tambin conocida como restriccin del crecimiento intrauterino (RCIU), se produce cuando un bebe no crece de forma normal durante el embarazo. Un beb con restriccin del crecimiento fetal es ms pequeo de lo que debera y puede pesar menos de lo normal en el nacimiento. La restriccin del crecimiento fetal puede ser consecuencia de un problema con la placenta, que es un rgano que le aporta oxgeno y nutricin al beb en gestacin (feto). Los bebs con restriccin del crecimiento fetal tienen un mayor riesgo de parto prematuro y pueden necesitar ms atencin de lo habitual despus del nacimiento. Cules son las causas? La causa ms frecuente de la restriccin del crecimiento fetal es un problema con la placenta o el cordn umbilical que hace que el feto reciba menos oxgeno o nutricin de lo que necesita. Otras causas son las siguientes:  Mala nutricin materna y un aumento de peso insuficiente durante el [place].  Exposicin a productos qumicos de sustancias como cigarrillos, alcohol y algunas drogas.  Algunos medicamentos recetados.  Otros problemas que se desarrollan en el tero (defectos congnitos de nacimiento).  Trastornos genticos.  Infeccin.  Embarazo mltiple. Qu incrementa el riesgo? Es ms probable que esta afeccin afecte a su beb si usted:  Es mayor de 35aos o menor de 16aos de edad.  Tiene enfermedades, como presin arterial alta, preeclampsia, diabetes, enfermedad cardaca o renal, lupus eritematoso sistmico o anemia.  Vive en una zona de mucha altitud  [employer].  Tiene antecedentes personales o familiares de: ? Restriccin del crecimiento fetal. ? Un trastorno gentico.  Se ha realizado tratamientos para ayudarla a tener hijos (tratamientos para la infertilidad). Cules son los signos o sntomas? La restriccin del crecimiento fetal no causa muchos sntomas. Su mdico puede sospechar la presencia de esta afeccin si la zona de su vientre (tero) no es tan grande como se espera para la etapa de su embarazo. Cmo se diagnostica? Esta afeccin se diagnostica mediante exmenes fsicos y exmenes prenatales. Tambin puede tener:  Mediciones de la altura del tero para Chief Operating Officer su tamao. La altura del tero es la distancia desde el hueso pbico hasta la parte superior del tero.  Una ecografa para medir el tamao del beb en comparacin con el tamao de otros bebs en la misma etapa de desarrollo (edad gestacional). Tambin pueden hacerle estudios para hallar la causa de la restriccin del crecimiento fetal. Estos pueden incluir:  Amniocentesis. En este procedimiento, se [place]  aguja por el tero para recolectar una muestra del lquido que rodea al feto (lquido amnitico). Esto se puede realizar para detectar signos de infeccin o defectos congnitos.  Estudios para evaluar el flujo de sangre al beb y la placenta. Cmo se trata? En la [employer], el objetivo del tratamiento es tratar la causa de la restriccin del crecimiento fetal. El mdico monitorear el embarazo de cerca y la ayudar a que todo est controlado. Si la causa de su afeccin es un problema con la placenta y el beb no est recibiendo suficiente sangre, usted puede necesitar lo siguiente:  Un medicamento para dar inicio al trabajo de parto y que el beb nazca antes (induccin).  Un parto por cesrea. En este procedimiento, el beb nace a travs de una incisin que se realiza en el abdomen y Careers information officer. Siga estas instrucciones en su  casa: Medicamentos  Use los medicamentos de venta libre y los recetados solamente como se lo haya indicado el mdico. Esto incluye las vitaminas y suplementos.  Asegrese de que el mdico est al tanto y apruebe todos los medicamentos, suplementos, vitaminas, gotas oftlmicas y cremas que [place]. Indicaciones generales  Siga una dieta saludable que incluya frutas y verduras frescas, protenas magras, cereales integrales y alimentos con alto contenido de calcio como [place], Dentist y verduras de hoja verde oscuro. Colabore con su mdico o un nutricionista para asegurarse de que: ? Est recibiendo la cantidad suficiente de nutrientes. ? Est aumentando de peso lo suficiente durante el [employer].  Descanse todo lo que sea necesario. Trate de dormir al menos 8 horas todas las noches.  No beba alcohol ni consumas drogas.  No consuma ningn producto que contenga nicotina o tabaco. Estos productos incluyen cigarrillos, tabaco para Theatre manager y aparatos de vapeo, como los Administrator, Civil Service. Si necesita ayuda para dejar de consumir estos productos, consulte al [employer].  Cumpla con todas las visitas de seguimiento. Esto es importante. Solicite ayuda de inmediato si:  Nota que el beb en gestacin se mueve menos que lo habitual o no se mueve.  Tiene contracciones separadas unas de otras por intervalos de 5 minutos o menos, o que aumentan en frecuencia, intensidad o duracin.  Tiene signos y sntomas de infeccin, incluyendo fiebre.  Tiene una hemorragia vaginal abundante.  Tiene ms hinchazn en las piernas, las manos o el rostro.  Tiene cambios en la visin, como ver manchas o tener visin borrosa o ver doble.  Siente un dolor de cabeza intenso que no se [place].  Tiene un dolor sbito y agudo en el abdomen o dolor lumbar.  Le sale un chorro o goteo incontrolable de lquido de la vagina. Resumen  La restriccin del crecimiento fetal se produce cuando un feto no crece de forma normal  durante el embarazo.  La causa ms frecuente de la restriccin del crecimiento fetal es un problema con la placenta o el cordn umbilical que hace que el feto reciba menos oxgeno o nutricin de lo que necesita.  Esta afeccin se diagnostica mediante exmenes fsicos y prenatales.  El Programmer, multimedia del beb usando ecografas durante todo el [place].  Asegrese de que el mdico est al tanto y apruebe todos los medicamentos, suplementos, vitaminas, gotas oftlmicas y cremas que [place]. Esta informacin no tiene Theme park manager el consejo del mdico. Asegrese de hacerle al mdico cualquier pregunta que tenga. Document Revised: 05/20/2020 Document Reviewed: 03/31/2020 Elsevier Patient Education  2021 [employer].

## 2020-09-07 NOTE — Procedures (Signed)
Theresa Lambert Apr 12, 1986 [redacted]w[redacted]d  Fetus A Non-Stress Test Interpretation for 09/07/20  Indication: IUGR  Fetal Heart Rate A Mode: External Baseline Rate (A): 135 bpm Variability: Moderate Accelerations: 10 x 10 Decelerations: None Multiple birth?: No  Uterine Activity Mode: Palpation,Toco Contraction Frequency (min): 1 UC Contraction Quality: Mild Resting Tone Palpated: Relaxed Resting Time: Adequate  Interpretation (Fetal Testing) Nonstress Test Interpretation: Reactive Overall Impression: Reassuring for gestational age Comments: Dr. Parke Poisson reviewed tracing.

## 2020-09-14 ENCOUNTER — Other Ambulatory Visit: Payer: Self-pay

## 2020-09-14 ENCOUNTER — Ambulatory Visit: Payer: Self-pay | Attending: Obstetrics

## 2020-09-14 ENCOUNTER — Ambulatory Visit: Payer: Self-pay | Admitting: *Deleted

## 2020-09-14 ENCOUNTER — Other Ambulatory Visit: Payer: Self-pay | Admitting: *Deleted

## 2020-09-14 ENCOUNTER — Encounter: Payer: Self-pay | Admitting: *Deleted

## 2020-09-14 DIAGNOSIS — O365930 Maternal care for other known or suspected poor fetal growth, third trimester, not applicable or unspecified: Secondary | ICD-10-CM | POA: Insufficient documentation

## 2020-09-14 DIAGNOSIS — Z362 Encounter for other antenatal screening follow-up: Secondary | ICD-10-CM

## 2020-09-14 DIAGNOSIS — Z3A31 31 weeks gestation of pregnancy: Secondary | ICD-10-CM

## 2020-09-14 DIAGNOSIS — O09523 Supervision of elderly multigravida, third trimester: Secondary | ICD-10-CM

## 2020-09-14 DIAGNOSIS — O365990 Maternal care for other known or suspected poor fetal growth, unspecified trimester, not applicable or unspecified: Secondary | ICD-10-CM

## 2020-09-14 DIAGNOSIS — O0990 Supervision of high risk pregnancy, unspecified, unspecified trimester: Secondary | ICD-10-CM

## 2020-09-14 NOTE — Procedures (Signed)
Theresa Lambert [birth year] [redacted]w[redacted]d  Fetus A Non-Stress Test Interpretation for 09/14/20  Indication: IUGR  Fetal Heart Rate A Mode: External Baseline Rate (A): 140 bpm Variability: Moderate Accelerations: 15 x 15 Decelerations: None Multiple birth?: No  Uterine Activity Mode: Palpation,Toco Contraction Frequency (min): None Resting Tone Palpated: Relaxed Resting Time: Adequate  Interpretation (Fetal Testing) Nonstress Test Interpretation: Reactive Comments: Tracing reviewed by Dr. Judeth Cornfield.

## 2020-09-21 ENCOUNTER — Telehealth: Payer: Self-pay

## 2020-09-21 ENCOUNTER — Encounter: Payer: Self-pay | Admitting: Obstetrics and Gynecology

## 2020-09-21 ENCOUNTER — Ambulatory Visit: Payer: Self-pay | Admitting: *Deleted

## 2020-09-21 ENCOUNTER — Ambulatory Visit: Payer: Self-pay | Attending: Obstetrics

## 2020-09-21 ENCOUNTER — Ambulatory Visit (INDEPENDENT_AMBULATORY_CARE_PROVIDER_SITE_OTHER): Payer: Self-pay | Admitting: Obstetrics and Gynecology

## 2020-09-21 ENCOUNTER — Encounter: Payer: Self-pay | Admitting: *Deleted

## 2020-09-21 ENCOUNTER — Other Ambulatory Visit: Payer: Self-pay

## 2020-09-21 ENCOUNTER — Other Ambulatory Visit: Payer: Self-pay | Admitting: Obstetrics and Gynecology

## 2020-09-21 VITALS — BP 103/61 | HR 68 | Wt 138.1 lb

## 2020-09-21 DIAGNOSIS — O365931 Maternal care for other known or suspected poor fetal growth, third trimester, fetus 1: Secondary | ICD-10-CM

## 2020-09-21 DIAGNOSIS — O0990 Supervision of high risk pregnancy, unspecified, unspecified trimester: Secondary | ICD-10-CM

## 2020-09-21 DIAGNOSIS — Z3A32 32 weeks gestation of pregnancy: Secondary | ICD-10-CM

## 2020-09-21 DIAGNOSIS — O09523 Supervision of elderly multigravida, third trimester: Secondary | ICD-10-CM

## 2020-09-21 DIAGNOSIS — O365930 Maternal care for other known or suspected poor fetal growth, third trimester, not applicable or unspecified: Secondary | ICD-10-CM | POA: Insufficient documentation

## 2020-09-21 DIAGNOSIS — O365990 Maternal care for other known or suspected poor fetal growth, unspecified trimester, not applicable or unspecified: Secondary | ICD-10-CM | POA: Insufficient documentation

## 2020-09-21 MED ORDER — PRENATAL PLUS 27-1 MG PO TABS: 1.0000 | ORAL_TABLET | Freq: Every day | ORAL | 6 refills | Status: DC

## 2020-09-21 NOTE — Procedures (Signed)
Neyra Pettie Jun 13, 1986 [redacted]w[redacted]d  Fetus A Non-Stress Test Interpretation for 09/21/20  Indication: IUGR   Fetal Heart Rate A Mode: External Baseline Rate (A): 145 bpm Variability: Moderate Accelerations: 15 x 15 Decelerations: Variable Multiple birth?: No  Uterine Activity Mode: Palpation,Toco Contraction Frequency (min): 2 UC's Contraction Duration (sec): 50-90 Contraction Quality: Mild Resting Tone Palpated: Relaxed Resting Time: Adequate  Interpretation (Fetal Testing) Nonstress Test Interpretation: Reactive Overall Impression: Reassuring for gestational age Comments: Dr. Judeth Cornfield reviewed tracing.

## 2020-09-21 NOTE — Telephone Encounter (Signed)
Created [facility] for upcoming appointments and sent via mail.

## 2020-09-21 NOTE — Patient Instructions (Signed)
Tercer trimestre de embarazo Third Trimester of Pregnancy  El tercer trimestre de embarazo va desde la semana 28 hasta la semana 40. Tambin se dice que va desde el mes 7 hasta el mes 9. En este trimestre, el beb en gestacin (feto) crece muy rpidamente. Hacia el final del noveno mes, el beb en gestacin mide alrededor de 20pulgadas (45cm) de largo. Pesa entre 6y 10libras (2,70y 4,50kg). Cambios en el cuerpo durante el tercer trimestre Su organismo contina atravesando por muchos cambios durante este perodo. Los cambios varan y generalmente vuelven a la normalidad despus del nacimiento del beb. Cambios fsicos  Seguir aumentando de peso. Puede ser que aumente entre 25 y 35libras (11 y 16kg) hacia el final del embarazo. Si tiene bajo peso, puede aumentar entre 28 y 40lb (unos 13 a 18kg). Si tiene sobrepeso, puede aumentar entre 15 y 25 libras (unos 7 a 11kg).  Podrn aparecer las primeras estras en las caderas, el vientre (abdomen) y las mamas.  Las mamas seguirn creciendo y pueden doler. Un lquido amarillo (calostro) puede salir de sus pechos. Esta es la primera leche que usted produce para el beb.  Tal vez haya cambios en el cabello.  El ombligo puede salir hacia afuera.  Puede observar que se le hinchan ms las manos, la cara o los tobillos. Cambios en la salud  Es posible que tenga acidez estomacal.  Es posible que tenga dificultades para defecar (estreimiento).  Pueden aparecerle hemorroides. Estas son venas hinchadas en el ano que pueden picar o doler.  Puede comenzar a tener venas hinchadas (vrices) en las piernas.  Puede presentar ms dolor en la pelvis, la espalda o los muslos.  Puede presentar ms hormigueo o entumecimiento en las manos, los brazos y las piernas. La piel de su vientre tambin puede sentirse entumecida.  Es posible que sienta falta de aire a medida que el tero se agranda. Otros cambios  Es posible que haga pis (orine) con mayor  frecuencia.  Puede tener ms problemas para dormir.  Puede notar que el beb en gestacin "baja" o se mueve ms hacia bajo, en el vientre.  Puede notar ms secrecin proveniente de la vagina.  Puede sentir las articulaciones flojas y puede sentir dolor alrededor del hueso plvico. Siga estas instrucciones en su casa: Medicamentos  Use los medicamentos de venta libre y los recetados solamente como se lo haya indicado el mdico. Algunos medicamentos no son seguros durante el embarazo.  Tome vitaminas prenatales que contengan por lo menos 600microgramos (mcg) de cido flico. Comida y bebida  Consuma comidas saludables que incluyan lo siguiente: ? Frutas y verduras frescas. ? Cereales integrales. ? Buenas fuentes de protenas, como carne, huevos y tofu. ? Productos lcteos con bajo contenido de grasa.  Evite la carne cruda y el jugo, la leche y el queso sin pasteurizar. Estos portan grmenes que pueden provocar dao tanto a usted como al beb.  Tome 4 o 5 comidas pequeas en lugar de 3 comidas abundantes al da.  Es posible que deba tomar medidas para prevenir o tratar los problemas para defecar: ? Beber suficiente lquido para mantener el pis (orina) de color amarillo plido. ? Come alimentos ricos en fibra. Entre ellos, frijoles, cereales integrales y frutas y verduras frescas. ? Limitar los alimentos con alto contenido de grasa y azcar. Estos incluyen alimentos fritos o dulces. Actividad  Haga ejercicios solamente como se lo haya indicado el mdico. Interrumpa la actividad fsica si comienza a tener clicos en el tero.  Evite   levantar pesos [employer].  No haga ejercicio si hace demasiado calor, hay demasiada humedad o se encuentra en un lugar de mucha altura (altitud elevada).  Si lo desea, puede continuar teniendo [employer], a menos que el mdico le indique lo contrario. Alivio del dolor y del malestar  Haga pausas con frecuencia y descanse con las piernas  levantadas (elevadas) si tiene calambres en las piernas o dolor en la parte baja de la espalda.  Dese baos de asiento con agua tibia para Best boy o las molestias causadas por las hemorroides. Use una crema para las hemorroides si el mdico la autoriza.  Use un sostn que le brinde buen soporte si sus mamas estn sensibles.  Si desarrolla venas hinchadas y abultadas en las piernas: ? Use medias de compresin segn las indicaciones de su mdico. ? Levante los pies durante 24minutos, 3 o 4veces por Training and development officer. ? Limite la sal en sus alimentos. Seguridad  Hable con el mdico antes de Control and instrumentation engineer.  No se d baos de inmersin en agua caliente, baos turcos ni saunas.  Use el cinturn de seguridad en todo momento mientras vaya en auto.  Hable con el mdico si alguien le est haciendo dao o gritando [place]. Preparacin para la llegada del beb Para prepararse para la llegada de su beb:  Tome clases prenatales.  Visite el hospital y recorra el rea de maternidad.  Compre un asiento de seguridad [employer] atrs para llevar al beb en el automvil. Aprenda cmo instalarlo en el auto.  Prepare la habitacin del beb. Saque todas las almohadas y los animales de peluche de la cuna del beb. Instrucciones generales  Evite el contacto con las bandejas sanitarias de los gatos y la tierra que estos animales usan. Estos contienen grmenes que pueden daar al beb y causar la prdida del beb ya sea aborto espontneo o muerte fetal.  No se haga duchas vaginales ni use tampones. No use tampones ni toallas higinicas perfumadas.  No fume ni consuma ningn producto que contenga nicotina o tabaco. Si necesita ayuda para dejar de fumar, consulte al mdico.  No beba alcohol.  No use medicamentos a base de hierbas, drogas ilegales, ni medicamentos que el mdico no haya autorizado. Las sustancias qumicas de estos productos pueden afectar al beb.  Cumpla con todas las  visitas de seguimiento. Esto es importante. Dnde buscar ms informacin  American Pregnancy Association (Asociacin Americana del Embarazo): americanpregnancy.org  [employer] of Obstetricians and Gynecologists (Colegio Estadounidense de Obstetras y Gineclogos): www.acog.org  Office on [employer] ([place]): [email or web address] Comunquese con un mdico si:  Tiene fiebre.  Tiene clicos leves o siente presin en la parte baja del vientre.  Sufre un dolor persistente en el abdomen.  Vomita o hace deposiciones acuosas (diarrea).  Advierte lquido con mal olor que proviene de la vagina.  Siente dolor al orinar o hace orina con mal olor.  Tiene un dolor de cabeza que no desaparece despus de Teacher, adult education.  Nota cambios en la visin o ve manchas delante de los ojos. Solicite ayuda de inmediato si:  Rompe la bolsa.  Tiene contracciones regulares separadas por menos de 54minutos.  Tiene sangrado o pequeas prdidas vaginales.  Tiene clicos o dolor muy intensos en el vientre.  Tiene dificultad para respirar.  Sientes dolor en el pecho.  Se desmaya.  No ha sentido al beb moverse durante el tiempo que le indic el mdico.  Tiene dolor, hinchazn o enrojecimiento nuevos  en un brazo o una pierna o se produce un aumento de alguno de estos sntomas. Resumen  El tercer trimestre comprende desde la [employer] la [id number] (desde el mes7 hasta el mes9). Esta es la poca en que el beb en gestacin crece muy rpidamente.  Durante este perodo, las molestias pueden aumentar a medida que usted sube de peso y el beb crece.  Preprese para la llegada del beb: asista a las clases prenatales, compre un asiento de seguridad orientado hacia atrs para llevar al beb en auto y prepare la habitacin del beb.  Solicite ayuda de inmediato si tiene sangrado por la vagina, siente dolor en el pecho y tiene dificultad para respirar, o si  no ha sentido al beb moverse durante el tiempo que le indic el mdico. Esta informacin no tiene Marine scientist el consejo del mdico. Asegrese de hacerle al mdico cualquier pregunta que tenga. Document Revised: 01/22/2020 Document Reviewed: 01/22/2020 Elsevier Patient Education  [place].

## 2020-09-21 NOTE — Progress Notes (Signed)
Subjective:  Theresa Lambert is a 35 y.o. G4P3003 at [redacted]w[redacted]d being seen today for ongoing prenatal care.  She is currently monitored for the following issues for this high-risk pregnancy and has Supervision of high risk pregnancy, antepartum and IUGR (intrauterine growth restriction) affecting care of mother on their problem list.  Patient reports general discomforts of pregnancy.  Contractions: Irritability. Vag. Bleeding: None.  Movement: Present. Denies leaking of fluid.   The following portions of the patient's history were reviewed and updated as appropriate: allergies, current medications, past family history, past medical history, past social history, past surgical history and problem list. Problem list updated.  Objective:   Vitals:   09/21/20 1519  BP: 103/61  Pulse: 68  Weight: 138 lb 1.6 oz (62.6 kg)    Fetal Status: Fetal Heart Rate (bpm): 130   Movement: Present     General:  Alert, oriented and cooperative. Patient is in no acute distress.  Skin: Skin is warm and dry. No rash noted.   Cardiovascular: Normal heart rate noted  Respiratory: Normal respiratory effort, no problems with respiration noted  Abdomen: Soft, gravid, appropriate for gestational age. Pain/Pressure: Present     Pelvic:  Cervical exam deferred        Extremities: Normal range of motion.  Edema: None  Mental Status: Normal mood and affect. Normal behavior. Normal judgment and thought content.   Urinalysis:      Assessment and Plan:  Pregnancy: G4P3003 at [redacted]w[redacted]d  1. Supervision of high risk pregnancy, antepartum Stable - prenatal vitamin w/FE, FA (PRENATAL 1 + 1) 27-1 MG TABS tablet; Take 1 tablet by mouth daily at 12 noon.  Dispense: 30 tablet; Refill: 6  2. Poor fetal growth affecting management of mother in third trimester, single or unspecified fetus BPP 10/10 with normal dopplers today EFW < 1 % on 09/14/20 Continue with weekly antenatal testing   Preterm labor symptoms and general obstetric  precautions including but not limited to vaginal bleeding, contractions, leaking of fluid and fetal movement were reviewed in detail with the patient. Please refer to After Visit Summary for other counseling recommendations.  Return in about 2 weeks (around 10/05/2020) for OB visit, face to face, MD only, pt request.   Hermina Staggers, MD

## 2020-09-28 ENCOUNTER — Encounter: Payer: Self-pay | Admitting: *Deleted

## 2020-09-28 ENCOUNTER — Ambulatory Visit: Payer: Self-pay | Attending: Obstetrics

## 2020-09-28 ENCOUNTER — Other Ambulatory Visit: Payer: Self-pay

## 2020-09-28 ENCOUNTER — Ambulatory Visit: Payer: Self-pay | Admitting: *Deleted

## 2020-09-28 DIAGNOSIS — O0990 Supervision of high risk pregnancy, unspecified, unspecified trimester: Secondary | ICD-10-CM

## 2020-09-28 DIAGNOSIS — O365990 Maternal care for other known or suspected poor fetal growth, unspecified trimester, not applicable or unspecified: Secondary | ICD-10-CM

## 2020-09-28 DIAGNOSIS — O365930 Maternal care for other known or suspected poor fetal growth, third trimester, not applicable or unspecified: Secondary | ICD-10-CM | POA: Insufficient documentation

## 2020-09-28 DIAGNOSIS — O09523 Supervision of elderly multigravida, third trimester: Secondary | ICD-10-CM

## 2020-09-28 DIAGNOSIS — Z3A33 33 weeks gestation of pregnancy: Secondary | ICD-10-CM

## 2020-09-28 NOTE — Procedures (Signed)
Theresa Lambert 1986/06/23 [redacted]w[redacted]d  Fetus A Non-Stress Test Interpretation for 09/28/20  Indication: IUGR  Fetal Heart Rate A Mode: External Baseline Rate (A): 130 bpm Variability: Moderate Accelerations: 15 x 15 Decelerations: None Multiple birth?: No  Uterine Activity Mode: Palpation,Toco Contraction Frequency (min): 2-4 Contraction Duration (sec): 30-50 Contraction Quality: Mild Resting Tone Palpated: Relaxed Resting Time: Adequate  Interpretation (Fetal Testing) Nonstress Test Interpretation: Reactive Comments: Dr. Judeth Cornfield reiviewed tracing.

## 2020-10-05 ENCOUNTER — Ambulatory Visit: Payer: Self-pay | Admitting: *Deleted

## 2020-10-05 ENCOUNTER — Encounter: Payer: Self-pay | Admitting: *Deleted

## 2020-10-05 ENCOUNTER — Encounter: Payer: Self-pay | Admitting: Obstetrics and Gynecology

## 2020-10-05 ENCOUNTER — Other Ambulatory Visit: Payer: Self-pay

## 2020-10-05 ENCOUNTER — Ambulatory Visit: Payer: Self-pay | Attending: Obstetrics and Gynecology

## 2020-10-05 DIAGNOSIS — O0990 Supervision of high risk pregnancy, unspecified, unspecified trimester: Secondary | ICD-10-CM | POA: Insufficient documentation

## 2020-10-05 DIAGNOSIS — Z3A34 34 weeks gestation of pregnancy: Secondary | ICD-10-CM

## 2020-10-05 DIAGNOSIS — O365930 Maternal care for other known or suspected poor fetal growth, third trimester, not applicable or unspecified: Secondary | ICD-10-CM | POA: Insufficient documentation

## 2020-10-05 DIAGNOSIS — O365990 Maternal care for other known or suspected poor fetal growth, unspecified trimester, not applicable or unspecified: Secondary | ICD-10-CM | POA: Insufficient documentation

## 2020-10-05 DIAGNOSIS — O09523 Supervision of elderly multigravida, third trimester: Secondary | ICD-10-CM

## 2020-10-05 NOTE — Procedures (Signed)
Theresa Lambert August 03, 1985 [redacted]w[redacted]d  Fetus A Non-Stress Test Interpretation for 10/05/20  Indication: IUGR, AMA  Fetal Heart Rate A Mode: External Baseline Rate (A): 130 bpm Variability: Moderate Accelerations: 15 x 15 Decelerations: Variable (X1) Multiple birth?: No  Uterine Activity Mode: Palpation,Toco Contraction Frequency (min): Irreg w/UI Contraction Duration (sec): 10-40 Contraction Quality: Mild Resting Tone Palpated: Relaxed Resting Time: Adequate  Interpretation (Fetal Testing) Nonstress Test Interpretation: Reactive Comments: Dr. Judeth Cornfield reviewed tracing.

## 2020-10-12 ENCOUNTER — Ambulatory Visit: Payer: Self-pay | Attending: Obstetrics and Gynecology | Admitting: *Deleted

## 2020-10-12 ENCOUNTER — Other Ambulatory Visit: Payer: Self-pay

## 2020-10-12 ENCOUNTER — Encounter: Payer: Self-pay | Admitting: *Deleted

## 2020-10-12 ENCOUNTER — Ambulatory Visit (HOSPITAL_BASED_OUTPATIENT_CLINIC_OR_DEPARTMENT_OTHER): Payer: Self-pay

## 2020-10-12 ENCOUNTER — Ambulatory Visit: Payer: Self-pay | Admitting: *Deleted

## 2020-10-12 DIAGNOSIS — O365990 Maternal care for other known or suspected poor fetal growth, unspecified trimester, not applicable or unspecified: Secondary | ICD-10-CM

## 2020-10-12 DIAGNOSIS — O09523 Supervision of elderly multigravida, third trimester: Secondary | ICD-10-CM | POA: Insufficient documentation

## 2020-10-12 DIAGNOSIS — O365930 Maternal care for other known or suspected poor fetal growth, third trimester, not applicable or unspecified: Secondary | ICD-10-CM

## 2020-10-12 DIAGNOSIS — O0990 Supervision of high risk pregnancy, unspecified, unspecified trimester: Secondary | ICD-10-CM

## 2020-10-12 DIAGNOSIS — Z3A35 35 weeks gestation of pregnancy: Secondary | ICD-10-CM

## 2020-10-12 NOTE — Procedures (Signed)
Theresa Lambert [birth year] [redacted]w[redacted]d  Fetus A Non-Stress Test Interpretation for 10/12/20  Indication: IUGR  Fetal Heart Rate A Mode: External Baseline Rate (A): 145 bpm Variability: Moderate Accelerations: 15 x 15 Decelerations: None Multiple birth?: No  Uterine Activity Mode: Palpation,Toco Contraction Frequency (min): 1 UC Contraction Duration (sec): 40 Contraction Quality: Mild Resting Tone Palpated: Relaxed Resting Time: Adequate  Interpretation (Fetal Testing) Nonstress Test Interpretation: Reactive Overall Impression: Reassuring for gestational age Comments: Dr. Judeth Cornfield reviewed tracing.

## 2020-10-15 ENCOUNTER — Other Ambulatory Visit (HOSPITAL_COMMUNITY)
Admission: RE | Admit: 2020-10-15 | Discharge: 2020-10-15 | Disposition: A | Discharge status: Home / Self Care | Payer: Self-pay | Source: Ambulatory Visit | Attending: Obstetrics and Gynecology | Admitting: Obstetrics and Gynecology

## 2020-10-15 ENCOUNTER — Other Ambulatory Visit: Payer: Self-pay

## 2020-10-15 ENCOUNTER — Other Ambulatory Visit (HOSPITAL_COMMUNITY): Payer: Self-pay | Admitting: Advanced Practice Midwife

## 2020-10-15 ENCOUNTER — Encounter: Payer: Self-pay | Admitting: Obstetrics & Gynecology

## 2020-10-15 ENCOUNTER — Other Ambulatory Visit: Payer: Self-pay | Admitting: Obstetrics & Gynecology

## 2020-10-15 ENCOUNTER — Ambulatory Visit (INDEPENDENT_AMBULATORY_CARE_PROVIDER_SITE_OTHER): Payer: Self-pay | Admitting: Obstetrics & Gynecology

## 2020-10-15 VITALS — BP 94/55 | HR 97 | Wt 140.0 lb

## 2020-10-15 DIAGNOSIS — R519 Headache, unspecified: Secondary | ICD-10-CM

## 2020-10-15 DIAGNOSIS — O0990 Supervision of high risk pregnancy, unspecified, unspecified trimester: Secondary | ICD-10-CM

## 2020-10-15 DIAGNOSIS — O365930 Maternal care for other known or suspected poor fetal growth, third trimester, not applicable or unspecified: Secondary | ICD-10-CM

## 2020-10-15 DIAGNOSIS — O26899 Other specified pregnancy related conditions, unspecified trimester: Secondary | ICD-10-CM

## 2020-10-15 MED ORDER — BUTALBITAL-APAP-CAFFEINE 50-325-40 MG PO TABS
1.0000 | ORAL_TABLET | Freq: Four times a day (QID) | ORAL | 0 refills | Status: DC | PRN
Start: 2020-10-15 — End: 2020-10-27

## 2020-10-15 NOTE — Progress Notes (Signed)
Patient reports bad headaches with dizziness and blurry vision since Sunday- rest and tylenol do not help

## 2020-10-15 NOTE — Patient Instructions (Signed)
Induccin del trabajo de parto Labor Induction La induccin del trabajo de parto hace referencia a las acciones que se inician para Radio producer que una mujer embarazada comience el [place] de [place]. La [employer] de las mujeres comienzan el trabajo de parto de forma natural entre las semanas 37 y 42 del Psychiatrist. Cuando esto no ocurre o cuando, por necesidad [place], se debe iniciar el [place] de [place], se pueden seguir otros pasos para inducir el trabajo de [place]. La induccin del trabajo de parto hace que el tero se contraiga. Tambin hace que el cuello uterino se ablande (madure), se abra (se dilate) y se afine. Generalmente, el trabajo de parto no se induce antes de [address], excepto que haya un motivo mdico para Media planner. Cundo se considera la induccin del [place] de Trail? La induccin del [place] de parto puede ser [place] para usted si:  Su embarazo dura ms de 41 a 42 semanas.  Se le est separando la placenta del tero (desprendimiento de la placenta).  Rompe la bolsa de aguas y [place] de parto no comienza.  Tiene problemas de [place], como diabetes o presin arterial alta (preeclampsia) durante el embarazo.  Su beb ha dejado de crecer o no tiene suficiente lquido Teacher, music. Antes de inducir el trabajo de [place], el mdico tendr en cuenta los siguientes factores:  Su estado clnico y [place] del beb.  Cuntas semanas tiene de [place].  La madurez de los pulmones del beb.  El estado del cuello uterino.  La posicin del beb.  El tamao del canal del parto. Informe al mdico acerca de lo siguiente:  Cualquier alergia que tenga.  Todos los [employer] [place], incluidos vitaminas, hierbas, gotas oftlmicas, cremas y [address] de [address].  Problemas previos que usted o sus familiares hayan tenido con los anestsicos.  Cirugas a las que se haya sometido.  Cualquier trastorno de la sangre que tenga.  Cualquier afeccin mdica que tenga. Cules son los  riesgos? En general, se trata de un procedimiento seguro. Sin embargo, pueden ocurrir complicaciones, por ejemplo:  Fracaso de la induccin.  Cambios en la frecuencia cardaca fetal, por ejemplo, los latidos son demasiado rpidos o lentos, o son irregulares (errticos).  Infeccin en la madre o el beb.  Aumento de la posibilidad de que sea necesaria una cesrea.  Ruptura (desprendimiento) de la placenta del tero. Esto es poco frecuente.  Ruptura del tero. Esto ocurre en muy contadas ocasiones.  El beb podra no recibir suficiente irrigacin sangunea u oxgeno. Esto puede ser potencialmente mortal. Cuando es necesario realizar una induccin por motivos mdicos, los beneficios suelen [employer]. Qu ocurre durante el procedimiento? Durante el procedimiento, el mdico utilizar uno de estos mtodos para inducir el [place] de parto:  Ruptura de las [place]. En este mtodo, se separa, con cuidado, el tejido del saco amnitico del cuello uterino. Esto provoca que suceda lo siguiente: ? El cuello uterino se estira, lo que, a su vez, provoca la liberacin de prostaglandinas. ? Las prostaglandinas inducen el [place] de parto y [place] que el tero se Technical sales engineer. ? Con frecuencia, este procedimiento se realiza durante una visita en el consultorio mdico. Le indicarn que vuelva a su casa y espere que se inicien las contracciones.  Administracin de prostaglandina. Este medicamento hace que se inicien las contracciones y que el cuello uterino se dilate y [place]. Puede tomarse por la boca (por va oral) o introducirse en la vagina (supositorio).  Insercin de un pequeo tubo delgado (catter) que  tiene un baln en el extremo en la vagina; luego, expansin del baln con agua para dilatar el cuello uterino.  Romper la bolsa de las aguas. En este mtodo, se [place] un instrumento pequeo para hacer un pequeo orificio en el saco amnitico. Al cabo de un tiempo, esto har que el saco amnitico  se rompa. Las contracciones deberan comenzar algunas horas despus.  Medicamentos que desencadenen o intensifiquen las contracciones. Estos se administran a travs de una va intravenosa (IV) que se coloca en una vena del brazo. Este procedimiento puede variar segn el mdico y el hospital.   Dnde buscar ms informacin  March of Dimes: www.marchofdimes.org  [employer] of Obstetricians and Gynecologists ([employer] Estad[employer]id[employer] de Obstetras y Gineclogos): www.acog.org Resumen  La induccin del trabajo de parto hace que el tero se contraiga. Tambin hace que el cuello uterino se ablande (madure), se abra (se dilate) y se afine.  Generalmente, el trabajo de [place] no se induce antes de las 39semanas de [place], excepto que haya un motivo mdico para Media planner.  Cuando es Passenger transport manager una induccin por motivos mdicos, los beneficios suelen [employer].  Hable con su mdico sobre qu mtodos de induccin del [place] de parto son adecuados para usted. Esta informacin no tiene Theme park manager el consejo del mdico. Asegrese de hacerle al mdico cualquier pregunta que tenga. Document Revised: 05/22/2020 Document Reviewed: 05/22/2020 Elsevier Patient Education  2021 [employer].

## 2020-10-15 NOTE — Progress Notes (Signed)
   PRENATAL VISIT NOTE  Subjective:  Theresa Lambert is a 35 y.o. G4P3003 at [redacted]w[redacted]d being seen today for ongoing prenatal care.  She is currently monitored for the following issues for this high-risk pregnancy and has Supervision of high risk pregnancy, antepartum and IUGR (intrauterine growth restriction) affecting care of mother on their problem list.  Patient reports headache.  Contractions: Irritability. Vag. Bleeding: None.  Movement: Present. Denies leaking of fluid.   The following portions of the patient's history were reviewed and updated as appropriate: allergies, current medications, past family history, past medical history, past social history, past surgical history and problem list.   Objective:   Vitals:   10/15/20 0935  BP: (!) 94/55  Pulse: 97  Weight: 140 lb (63.5 kg)    Fetal Status: Fetal Heart Rate (bpm): 141   Movement: Present  Presentation: Vertex  General:  Alert, oriented and cooperative. Patient is in no acute distress.  Skin: Skin is warm and dry. No rash noted.   Cardiovascular: Normal heart rate noted  Respiratory: Normal respiratory effort, no problems with respiration noted  Abdomen: Soft, gravid, appropriate for gestational age.  Pain/Pressure: Present     Pelvic: Cervical exam performed in the presence of a chaperone Dilation: 1 Effacement (%): 20 Station: Ballotable  Extremities: Normal range of motion.  Edema: None  Mental Status: Normal mood and affect. Normal behavior. Normal judgment and thought content.   Assessment and Plan:  Pregnancy: G4P3003 at [redacted]w[redacted]d 1. Supervision of high risk pregnancy, antepartum Routine testing - Culture, beta strep (group b only) - GC/Chlamydia probe amp ([place])not at [facility]  2. Headache in pregnancy, antepartum Bitemporal and occipital without elevated BP, c/w TTHA - butalbital-acetaminophen-caffeine (FIORICET) 50-325-40 MG tablet; Take 1-2 tablets by mouth every 6 (six) hours as needed for headache.   Dispense: 12 tablet; Refill: 0  3. Poor fetal growth affecting management of mother in third trimester, single or unspecified fetus IOL planned 37 weeks  Preterm labor symptoms and general obstetric precautions including but not limited to vaginal bleeding, contractions, leaking of fluid and fetal movement were reviewed in detail with the patient. Please refer to After Visit Summary for other counseling recommendations.   Return in about 1 week (around 10/22/2020).  Future Appointments  Date Time Provider Department Center  10/19/2020  2:00 PM [facility] NURSE [facility]  10/19/2020  2:15 PM [facility] US2 [facility]  10/19/2020  3:15 PM [facility] NST [facility]  10/25/2020  8:15 AM [facility]-LD SCHED ROOM [facility]INDC None    Scheryl Darter, MD

## 2020-10-16 LAB — GC/CHLAMYDIA PROBE AMP (~~LOC~~) NOT AT ARMC
Chlamydia: NEGATIVE
Comment: NEGATIVE
Comment: NORMAL
Neisseria Gonorrhea: NEGATIVE

## 2020-10-19 ENCOUNTER — Ambulatory Visit: Payer: Self-pay | Attending: Obstetrics and Gynecology

## 2020-10-19 ENCOUNTER — Encounter: Payer: Self-pay | Admitting: *Deleted

## 2020-10-19 ENCOUNTER — Other Ambulatory Visit: Payer: Self-pay

## 2020-10-19 ENCOUNTER — Ambulatory Visit: Payer: Self-pay | Admitting: *Deleted

## 2020-10-19 ENCOUNTER — Telehealth (HOSPITAL_COMMUNITY): Payer: Self-pay | Admitting: *Deleted

## 2020-10-19 ENCOUNTER — Encounter (HOSPITAL_COMMUNITY): Payer: Self-pay | Admitting: *Deleted

## 2020-10-19 ENCOUNTER — Other Ambulatory Visit: Payer: Self-pay | Admitting: Obstetrics and Gynecology

## 2020-10-19 DIAGNOSIS — O365930 Maternal care for other known or suspected poor fetal growth, third trimester, not applicable or unspecified: Secondary | ICD-10-CM

## 2020-10-19 DIAGNOSIS — O365931 Maternal care for other known or suspected poor fetal growth, third trimester, fetus 1: Secondary | ICD-10-CM | POA: Insufficient documentation

## 2020-10-19 DIAGNOSIS — O0990 Supervision of high risk pregnancy, unspecified, unspecified trimester: Secondary | ICD-10-CM | POA: Insufficient documentation

## 2020-10-19 LAB — CULTURE, BETA STREP (GROUP B ONLY): Strep Gp B Culture: NEGATIVE

## 2020-10-19 NOTE — Telephone Encounter (Signed)
[id number] interpreter number  Preadmission screen

## 2020-10-19 NOTE — Procedures (Signed)
Theresa Lambert 1986/02/04 [redacted]w[redacted]d  Fetus A Non-Stress Test Interpretation for 10/19/20  Indication: IUGR  Fetal Heart Rate A Mode: External Baseline Rate (A): 130 bpm Variability: Moderate Accelerations: 15 x 15 Decelerations: None Multiple birth?: No  Uterine Activity Mode: Palpation,Toco Contraction Frequency (min): 3 ucs with ui Contraction Duration (sec): 50-80 Contraction Quality: Mild Resting Tone Palpated: Relaxed Resting Time: Adequate  Interpretation (Fetal Testing) Nonstress Test Interpretation: Reactive Overall Impression: Reassuring for gestational age Comments: Dr. Parke Poisson reviewed tracing.

## 2020-10-20 ENCOUNTER — Other Ambulatory Visit: Payer: Self-pay | Admitting: Advanced Practice Midwife

## 2020-10-20 ENCOUNTER — Encounter: Payer: Self-pay | Admitting: General Practice

## 2020-10-21 ENCOUNTER — Other Ambulatory Visit: Payer: Self-pay

## 2020-10-21 ENCOUNTER — Ambulatory Visit (INDEPENDENT_AMBULATORY_CARE_PROVIDER_SITE_OTHER): Payer: Self-pay | Admitting: Family Medicine

## 2020-10-21 VITALS — BP 104/60 | HR 71 | Wt 142.0 lb

## 2020-10-21 DIAGNOSIS — Z3A36 36 weeks gestation of pregnancy: Secondary | ICD-10-CM

## 2020-10-21 DIAGNOSIS — O0990 Supervision of high risk pregnancy, unspecified, unspecified trimester: Secondary | ICD-10-CM

## 2020-10-21 DIAGNOSIS — O365930 Maternal care for other known or suspected poor fetal growth, third trimester, not applicable or unspecified: Secondary | ICD-10-CM

## 2020-10-21 NOTE — Progress Notes (Signed)
   PRENATAL VISIT NOTE  Subjective:  Theresa Lambert is a 35 y.o. G4P3003 at [redacted]w[redacted]d being seen today for ongoing prenatal care.  She is currently monitored for the following issues for this high-risk pregnancy and has Supervision of high risk pregnancy, antepartum and IUGR (intrauterine growth restriction) affecting care of mother on their problem list.  Patient reports occasional contractions.  Contractions: Irritability. Vag. Bleeding: None.  Movement: Present. Denies leaking of fluid.   The following portions of the patient's history were reviewed and updated as appropriate: allergies, current medications, past family history, past medical history, past social history, past surgical history and problem list.   Objective:   Vitals:   10/21/20 0836  BP: 104/60  Pulse: 71  Weight: 142 lb (64.4 kg)    Fetal Status: Fetal Heart Rate (bpm): 155   Movement: Present     General:  Alert, oriented and cooperative. Patient is in no acute distress.  Skin: Skin is warm and dry. No rash noted.   Cardiovascular: Normal heart rate noted  Respiratory: Normal respiratory effort, no problems with respiration noted  Abdomen: Soft, gravid, appropriate for gestational age.  Pain/Pressure: Present     Pelvic: Cervical exam deferred        Extremities: Normal range of motion.  Edema: None  Mental Status: Normal mood and affect. Normal behavior. Normal judgment and thought content.   Assessment and Plan:  Pregnancy: G4P3003 at [redacted]w[redacted]d 1. [redacted] weeks gestation of pregnancy  2. Supervision of high risk pregnancy, antepartum FHT normal  3. Poor fetal growth affecting management of mother in third trimester, single or unspecified fetus Induction scheduled at 37 weeks.  Preterm labor symptoms and general obstetric precautions including but not limited to vaginal bleeding, contractions, leaking of fluid and fetal movement were reviewed in detail with the patient. Please refer to After Visit Summary for other  counseling recommendations.   No follow-ups on file.  Future Appointments  Date Time Provider Department Center  10/23/2020  9:45 AM [facility]-SCREENING [facility] None  10/25/2020  8:15 AM [facility]-LD SCHED ROOM [facility]INDC None    Levie Heritage, DO

## 2020-10-23 ENCOUNTER — Other Ambulatory Visit (HOSPITAL_COMMUNITY)
Admission: RE | Admit: 2020-10-23 | Discharge: 2020-10-23 | Disposition: A | Discharge status: Home / Self Care | Payer: Self-pay | Source: Ambulatory Visit | Attending: Family Medicine | Admitting: Family Medicine

## 2020-10-23 DIAGNOSIS — Z01812 Encounter for preprocedural laboratory examination: Secondary | ICD-10-CM | POA: Insufficient documentation

## 2020-10-23 DIAGNOSIS — Z20822 Contact with and (suspected) exposure to covid-19: Secondary | ICD-10-CM | POA: Insufficient documentation

## 2020-10-23 LAB — SARS CORONAVIRUS 2 (TAT 6-24 HRS): SARS Coronavirus 2: NEGATIVE

## 2020-10-25 ENCOUNTER — Other Ambulatory Visit: Payer: Self-pay

## 2020-10-25 ENCOUNTER — Inpatient Hospital Stay (HOSPITAL_COMMUNITY)
Admission: AD | Admit: 2020-10-25 | Discharge: 2020-10-27 | DRG: 807 | Disposition: A | Discharge status: Home / Self Care | Payer: Medicaid Other | Attending: Obstetrics and Gynecology | Admitting: Obstetrics and Gynecology

## 2020-10-25 ENCOUNTER — Inpatient Hospital Stay (HOSPITAL_COMMUNITY): Payer: Medicaid Other

## 2020-10-25 ENCOUNTER — Encounter (HOSPITAL_COMMUNITY): Payer: Self-pay | Admitting: Obstetrics & Gynecology

## 2020-10-25 DIAGNOSIS — Z3A37 37 weeks gestation of pregnancy: Secondary | ICD-10-CM

## 2020-10-25 DIAGNOSIS — Z3490 Encounter for supervision of normal pregnancy, unspecified, unspecified trimester: Secondary | ICD-10-CM

## 2020-10-25 DIAGNOSIS — O365930 Maternal care for other known or suspected poor fetal growth, third trimester, not applicable or unspecified: Secondary | ICD-10-CM | POA: Diagnosis present

## 2020-10-25 LAB — TYPE AND SCREEN
ABO/RH(D): O POS
Antibody Screen: NEGATIVE

## 2020-10-25 LAB — CBC
HCT: 31.2 % — ABNORMAL LOW (ref 36.0–46.0)
Hemoglobin: 11.1 g/dL — ABNORMAL LOW (ref 12.0–15.0)
MCH: 32.1 pg (ref 26.0–34.0)
MCHC: 35.6 g/dL (ref 30.0–36.0)
MCV: 90.2 fL (ref 80.0–100.0)
Platelets: 233 10*3/uL (ref 150–400)
RBC: 3.46 MIL/uL — ABNORMAL LOW (ref 3.87–5.11)
RDW: 12.8 % (ref 11.5–15.5)
WBC: 6.4 10*3/uL (ref 4.0–10.5)
nRBC: 0 % (ref 0.0–0.2)

## 2020-10-25 LAB — RPR: RPR Ser Ql: NONREACTIVE

## 2020-10-25 MED ORDER — OXYTOCIN-SODIUM CHLORIDE 30-0.9 UT/500ML-% IV SOLN
2.5000 [IU]/h | INTRAVENOUS | Status: DC
  Filled 2020-10-25: qty 500

## 2020-10-25 MED ORDER — EPHEDRINE 5 MG/ML INJ: 10.0000 mg | INTRAVENOUS | Status: DC | PRN

## 2020-10-25 MED ORDER — LIDOCAINE HCL (PF) 1 % IJ SOLN
30.0000 mL | INTRAMUSCULAR | Status: AC | PRN
  Administered 2020-10-26: 30 mL via SUBCUTANEOUS
  Filled 2020-10-25: qty 30

## 2020-10-25 MED ORDER — PHENYLEPHRINE 40 MCG/ML (10ML) SYRINGE FOR IV PUSH (FOR BLOOD PRESSURE SUPPORT): 80.0000 ug | PREFILLED_SYRINGE | INTRAVENOUS | Status: DC | PRN

## 2020-10-25 MED ORDER — ONDANSETRON HCL 4 MG/2ML IJ SOLN: 4.0000 mg | Freq: Four times a day (QID) | INTRAMUSCULAR | Status: DC | PRN

## 2020-10-25 MED ORDER — FENTANYL CITRATE (PF) 100 MCG/2ML IJ SOLN
INTRAMUSCULAR | Status: AC
  Filled 2020-10-25: qty 2

## 2020-10-25 MED ORDER — FENTANYL-BUPIVACAINE-NACL 0.5-0.125-0.9 MG/250ML-% EP SOLN
12.0000 mL/h | EPIDURAL | Status: DC | PRN
Start: 2020-10-25 — End: 2020-10-26

## 2020-10-25 MED ORDER — DIPHENHYDRAMINE HCL 50 MG/ML IJ SOLN: 12.5000 mg | INTRAMUSCULAR | Status: DC | PRN

## 2020-10-25 MED ORDER — LACTATED RINGERS IV SOLN: 500.0000 mL | Freq: Once | INTRAVENOUS | Status: DC

## 2020-10-25 MED ORDER — TERBUTALINE SULFATE 1 MG/ML IJ SOLN: 0.2500 mg | Freq: Once | INTRAMUSCULAR | Status: DC | PRN

## 2020-10-25 MED ORDER — LACTATED RINGERS IV SOLN: INTRAVENOUS | Status: DC

## 2020-10-25 MED ORDER — OXYCODONE-ACETAMINOPHEN 5-325 MG PO TABS: 1.0000 | ORAL_TABLET | ORAL | Status: DC | PRN

## 2020-10-25 MED ORDER — OXYTOCIN BOLUS FROM INFUSION
333.0000 mL | Freq: Once | INTRAVENOUS | Status: AC
Start: 2020-10-25 — End: 2020-10-26
  Administered 2020-10-26: 333 mL via INTRAVENOUS

## 2020-10-25 MED ORDER — LACTATED RINGERS IV SOLN
500.0000 mL | INTRAVENOUS | Status: DC | PRN
  Administered 2020-10-25: 500 mL via INTRAVENOUS

## 2020-10-25 MED ORDER — OXYTOCIN-SODIUM CHLORIDE 30-0.9 UT/500ML-% IV SOLN
1.0000 m[IU]/min | INTRAVENOUS | Status: DC
  Administered 2020-10-25: 2 m[IU]/min via INTRAVENOUS

## 2020-10-25 MED ORDER — FENTANYL CITRATE (PF) 100 MCG/2ML IJ SOLN
100.0000 ug | INTRAMUSCULAR | Status: DC | PRN
Start: 2020-10-25 — End: 2020-10-26
  Administered 2020-10-25: 100 ug via INTRAVENOUS

## 2020-10-25 MED ORDER — ACETAMINOPHEN 325 MG PO TABS: 650.0000 mg | ORAL_TABLET | ORAL | Status: DC | PRN

## 2020-10-25 MED ORDER — SOD CITRATE-CITRIC ACID 500-334 MG/5ML PO SOLN: 30.0000 mL | ORAL | Status: DC | PRN

## 2020-10-25 MED ORDER — OXYCODONE-ACETAMINOPHEN 5-325 MG PO TABS: 2.0000 | ORAL_TABLET | ORAL | Status: DC | PRN

## 2020-10-25 NOTE — Progress Notes (Signed)
Theresa Lambert is a 35 y.o. G4P3003 at [redacted]w[redacted]d.  Subjective: Uncomfortable w/ UC's. Requesting IV pain meds.    Objective: BP (!) 102/57   Pulse 63   Temp 98.5 F (36.9 C) (Oral)   Resp 18   Ht 5' (1.524 m)   Wt 63.5 kg   LMP 02/09/2020 (Exact Date)   BMI 27.34 kg/m    FHT:  FHR: 135  bpm, variability: mod,  accelerations:  15x15,  decelerations: variable x 1 while pt lying flat for foley placement. Resolved quickly with sitting up.  UC:   Q 2-3 minutes, mod Dilation: 1 Effacement (%): 50 Cervical Position: Posterior Station: -2 Presentation: Vertex Exam by:: Dorathy Kinsman, CNM Foley placed w/out difficulty.   Labs: Results for orders placed or performed during the hospital encounter of 10/25/20 (from the past 24 hour(s))  Type and screen     Status: None   Collection Time: 10/25/20  9:21 AM  Result Value Ref Range   ABO/RH(D) O POS    Antibody Screen NEG    Sample Expiration      10/28/2020,2359 Performed at [facility], [address] [place], [place] [address]   CBC     Status: Abnormal   Collection Time: 10/25/20  9:23 AM  Result Value Ref Range   WBC 6.4 4.0 - 10.5 K/uL   RBC 3.46 (L) 3.87 - 5.11 MIL/uL   Hemoglobin 11.1 (L) 12.0 - 15.0 g/dL   HCT 82.9 (L) 56.2 - 13.0 %   MCV 90.2 80.0 - 100.0 fL   MCH 32.1 26.0 - 34.0 pg   MCHC 35.6 30.0 - 36.0 g/dL   RDW 86.5 78.4 - 69.6 %   Platelets 233 150 - 400 K/uL   nRBC 0.0 0.0 - 0.2 %  RPR     Status: None   Collection Time: 10/25/20  9:23 AM  Result Value Ref Range   RPR Ser Ql NON REACTIVE NON REACTIVE    Assessment / Plan: [redacted]w[redacted]d week IUP Labor: Early/IOL. Continue low dose pitocin W/ Foley.  Fetal Wellbeing:  Category I Pain Control:  IV pain meds Anticipated MOD:  SVD  Dorathy Kinsman, CNM 10/25/2020 7:45 PM

## 2020-10-25 NOTE — H&P (Signed)
OBSTETRIC ADMISSION HISTORY AND PHYSICAL  Theresa Lambert is a 35 y.o. female [id number] with IUP at [redacted]w[redacted]d by 10wk [place] presenting for IOL for IUGR. She reports +FMs, No LOF, no VB, no blurry vision, headaches or peripheral edema, and RUQ pain.  She plans on breast and bottle feeding. She is undecided for birth control. She received her prenatal [facility] at [facility]   Dating: By 10wk [place] --->  Estimated Date of Delivery: 11/15/20  Sono:    @[redacted]w[redacted]d , CWD, normal anatomy, cephalic presentation, 1751g, EFW   Prenatal History/Complications:  -Severe Fetal growth restriction: see 4.0% above. Antenatal testing has been appropriate w normal dopplers and normal BPP's    Past Medical History: Past Medical History:  Diagnosis Date  . Anemia   . Gastritis   . Headache     Past Surgical History: Past Surgical History:  Procedure Laterality Date  . NO PAST SURGERIES      Obstetrical History: OB History    Gravida  4   Para  3   Term  3   Preterm  0   AB  0   Living  3     SAB  0   IAB  0   Ectopic  0   Multiple  0   Live Births  3           Social History Social History   Socioeconomic History  . Marital status: Single    Spouse name: Not on file  . Number of children: Not on file  . Years of education: Not on file  . Highest education level: Not on file  Occupational History  . Not on file  Tobacco Use  . Smoking status: Never Smoker  . Smokeless tobacco: Never Used  Vaping Use  . Vaping Use: Never used  Substance and Sexual Activity  . Alcohol use: Not Currently  . Drug use: Never  . Sexual activity: Yes    Birth control/protection: None  Other Topics Concern  . Not on file  Social History Narrative  . Not on file   Social Determinants of Health   Financial Resource Strain: Not on file  Food Insecurity: Not on file  Transportation Needs: Not on file  Physical Activity: Not on file  Stress: Not on file  Social Connections: Not on file    Family  History: Family History  Problem Relation Age of Onset  . Asthma Son     Allergies: No Known Allergies  Medications Prior to Admission  Medication Sig Dispense Refill Last Dose  . butalbital-acetaminophen-caffeine (FIORICET) 50-325-40 MG tablet Take 1-2 tablets by mouth every 6 (six) hours as needed for headache. 12 tablet 0   . prenatal vitamin w/FE, FA (PRENATAL 1 + 1) 27-1 MG TABS tablet Take 1 tablet by mouth daily at 12 noon. 30 tablet 6      Review of Systems   All systems reviewed and negative except as stated in HPI  Blood pressure 112/60, pulse 67, temperature (!) 97.1 F (36.2 C), temperature source Axillary, resp. rate 18, height 5' (1.524 m), weight 63.5 kg, last menstrual period 02/09/2020. General appearance: alert, cooperative and no distress Lungs: clear to auscultation bilaterally Abdomen: soft, non-tender; bowel sounds normal Pelvic: n/a Extremities: Homans sign is negative, no sign of DVT Presentation: cephalic Fetal monitoringBaseline: 135 bpm, Variability: Good {> 6 bpm), Accelerations: Reactive and Decelerations: Variable: with exam Uterine activity: occasional uc's Dilation: 1 Effacement (%): 50 Station: -2 Exam by:: Theresa Lambert,cnm   Prenatal  labs: ABO, Rh: --/--/PENDING (04/03 1324) Antibody: PENDING (04/03 4010) Rubella: Immune (10/21 0000) RPR: Nonreactive (02/02 0000)  HBsAg: Negative (10/21 0000)  HIV: Non-reactive (10/21 0000)  GBS: Negative/-- (03/24 0958)  2 hr Glucola normal Genetic screening  Normal quad screen  Anatomy US normal (EFW 14%ile at time)   Prenatal Transfer Tool  Maternal Diabetes: No Genetic Screening: Normal Maternal Ultrasounds/Referrals: IUGR Fetal Ultrasounds or other Referrals:  Referred to Materal Fetal Medicine  Maternal Substance Abuse:  No Significant Maternal Medications:  None Significant Maternal Lab Results: Group B Strep negative  Results for orders placed or performed during the hospital encounter of  10/25/20 (from the past 24 hour(s))  Type and screen   Collection Time: 10/25/20  9:21 AM  Result Value Ref Range   ABO/RH(D) PENDING    Antibody Screen PENDING    Sample Expiration      10/28/2020,2359 Performed at [facility], [address] [place], [place] [address]   CBC   Collection Time: 10/25/20  9:23 AM  Result Value Ref Range   WBC 6.4 4.0 - 10.5 K/uL   RBC 3.46 (L) 3.87 - 5.11 MIL/uL   Hemoglobin 11.1 (L) 12.0 - 15.0 g/dL   HCT 66.4 (L) 40.3 - 47.4 %   MCV 90.2 80.0 - 100.0 fL   MCH 32.1 26.0 - 34.0 pg   MCHC 35.6 30.0 - 36.0 g/dL   RDW 25.9 56.3 - 87.5 %   Platelets 233 150 - 400 K/uL   nRBC 0.0 0.0 - 0.2 %    Patient Active Problem List   Diagnosis Date Noted  . Encounter for induction of labor 10/25/2020  . Supervision of high risk pregnancy, antepartum 09/07/2020  . IUGR (intrauterine growth restriction) affecting care of mother 09/07/2020    Assessment/Plan:  Theresa Lambert is a 35 y.o. G4P3003 at [redacted]w[redacted]d here for IOL for severe fetal growth restriction   #Labor: low dose pitocin and FB when able #Severe fetal growth restriction: efw 1.6%ile.   #Pain: Pain meds, epidural prn  #FWB: Cat 1 #ID:  gbs neg  #MOF: breast/bottle #MOC:undecided #Circ:  no  Theresa Lambert, CNM  10/25/2020, 10:05 AM

## 2020-10-25 NOTE — Progress Notes (Signed)
Labor Progress Note Mariame Rybolt is a 35 y.o. G4P3003 at [redacted]w[redacted]d presented for IOL for IUGR.  S: Patient complaing of pain.  O:  BP 97/63   Pulse (!) 59   Temp 98 F (36.7 C) (Oral)   Resp 18   Ht 5' (1.524 m)   Wt 63.5 kg   LMP 02/09/2020 (Exact Date)   BMI 27.34 kg/m  EFM: 130/ + Accelerations/ + Variable decelerations  CVE: Dilation: 1.5 Effacement (%): 70 Cervical Position: Posterior Station: -2 Presentation: Vertex Exam by:: [email or web address],rnc-ob   A&P: 35 y.o. [id number] [redacted]w[redacted]d. #Labor: Progressing well. Decreased Pitocin level.  Discussed with patient inserting Foley bulb to help progress labor along.  Patient amenable to this. #Pain: Pain meds, epidural prn #FWB: Category 1 #GBS negative #Severe fetal growth restriction: efw 1.6%ile.  Jovita Kussmaul, MD 5:44 PM

## 2020-10-26 ENCOUNTER — Encounter (HOSPITAL_COMMUNITY): Payer: Self-pay | Admitting: Obstetrics & Gynecology

## 2020-10-26 DIAGNOSIS — O365930 Maternal care for other known or suspected poor fetal growth, third trimester, not applicable or unspecified: Secondary | ICD-10-CM

## 2020-10-26 DIAGNOSIS — Z3A37 37 weeks gestation of pregnancy: Secondary | ICD-10-CM

## 2020-10-26 MED ORDER — ONDANSETRON HCL 4 MG PO TABS: 4.0000 mg | ORAL_TABLET | ORAL | Status: DC | PRN

## 2020-10-26 MED ORDER — BENZOCAINE-MENTHOL 20-0.5 % EX AERO: 1.0000 "application " | INHALATION_SPRAY | CUTANEOUS | Status: DC | PRN

## 2020-10-26 MED ORDER — ONDANSETRON HCL 4 MG/2ML IJ SOLN: 4.0000 mg | INTRAMUSCULAR | Status: DC | PRN

## 2020-10-26 MED ORDER — SENNOSIDES-DOCUSATE SODIUM 8.6-50 MG PO TABS
2.0000 | ORAL_TABLET | ORAL | Status: DC
  Administered 2020-10-26: 2 via ORAL
  Filled 2020-10-26: qty 2

## 2020-10-26 MED ORDER — DOCUSATE SODIUM 100 MG PO CAPS
100.0000 mg | ORAL_CAPSULE | Freq: Two times a day (BID) | ORAL | Status: DC
  Administered 2020-10-26: 100 mg via ORAL
  Filled 2020-10-26: qty 1

## 2020-10-26 MED ORDER — SIMETHICONE 80 MG PO CHEW
80.0000 mg | CHEWABLE_TABLET | ORAL | Status: DC | PRN
Start: 2020-10-26 — End: 2020-10-27

## 2020-10-26 MED ORDER — IBUPROFEN 600 MG PO TABS
600.0000 mg | ORAL_TABLET | Freq: Four times a day (QID) | ORAL | Status: DC
  Administered 2020-10-26 – 2020-10-27 (×4): 600 mg via ORAL
  Filled 2020-10-26 (×5): qty 1

## 2020-10-26 MED ORDER — ACETAMINOPHEN 325 MG PO TABS
650.0000 mg | ORAL_TABLET | ORAL | Status: DC | PRN
  Administered 2020-10-26 (×2): 650 mg via ORAL
  Filled 2020-10-26 (×2): qty 2

## 2020-10-26 MED ORDER — WITCH HAZEL-GLYCERIN EX PADS
1.0000 | MEDICATED_PAD | CUTANEOUS | Status: DC | PRN
Start: 2020-10-26 — End: 2020-10-27

## 2020-10-26 MED ORDER — DIPHENHYDRAMINE HCL 25 MG PO CAPS
25.0000 mg | ORAL_CAPSULE | Freq: Four times a day (QID) | ORAL | Status: DC | PRN
Start: 2020-10-26 — End: 2020-10-27

## 2020-10-26 MED ORDER — DIBUCAINE (PERIANAL) 1 % EX OINT
1.0000 | TOPICAL_OINTMENT | CUTANEOUS | Status: DC | PRN
Start: 2020-10-26 — End: 2020-10-27

## 2020-10-26 MED ORDER — PRENATAL MULTIVITAMIN CH
1.0000 | ORAL_TABLET | Freq: Every day | ORAL | Status: DC
  Administered 2020-10-26: 1 via ORAL
  Filled 2020-10-26: qty 1

## 2020-10-26 MED ORDER — COCONUT OIL OIL
1.0000 "application " | TOPICAL_OIL | Status: DC | PRN
  Administered 2020-10-26: 1 via TOPICAL

## 2020-10-26 MED ORDER — TETANUS-DIPHTH-ACELL PERTUSSIS 5-2.5-18.5 LF-MCG/0.5 IM SUSY: 0.5000 mL | PREFILLED_SYRINGE | Freq: Once | INTRAMUSCULAR | Status: DC

## 2020-10-26 NOTE — Lactation Note (Signed)
This note was copied from a baby's chart. Lactation Consultation Note  Patient Name: Theresa Lambert JSHFW'Y Date: 10/26/2020 Reason for consult: Mother's request;Initial assessment;Early term 37-38.6wks;NICU baby;Other (Comment) (Anemia) Age:37 hours  LC talked with Mother with help of translator Clide Dales [phone]. Mom stated during discussion, she only wanted to use her breast milk and formula. LC relayed information to RN, Lorriane Shire in NICU since Mother signed consent form for DBM last night.   Mom pumped once today but stated she did not see anything. LC encouraged Mother to pump q 3 hrs for 15 minutes, checked her flange size of 24. Mom provided with coconut oil for nipple care. Mom aware if her nipples swell she may need to increase flange size to 27. Mom stated currently size 24 is comfortable. Mom aware to contact RN to transport any EBM to NICU. Mom also aware she can pump in NICU as well.   NICU baby book provided in [place].  LC reviewed inpatient and outpatient services and gave Mom the book in spanish to review.   All questions answered at the end of the visit.   Mom's nipples are erect with no signs of trauma.   Maternal Data Does the patient have breastfeeding experience prior to this delivery?: Yes How long did the patient breastfeed?: 1 i/2 years  Feeding Mother's Current Feeding Choice: Breast Milk and Formula  LATCH Score                    Lactation Tools Discussed/Used Tools: Pump;Flanges;Coconut oil Flange Size: 24 Breast pump type: Double-Electric Breast Pump Pump Education: Setup, frequency, and cleaning;Milk Storage Reason for Pumping: increase stimulation Pumping frequency: every 3 hrs for 15 minutes  Interventions Interventions: Breast feeding basics reviewed;Breast compression;DEBP  Discharge Pump: Manual WIC Program: Yes (Mom given WIC number to call in the morning)  Consult Status Consult Status: Follow-up Date:  10/27/20 Follow-up type: In-patient    Kimberl Vig  Nicholson-Springer 10/26/2020, 5:29 PM

## 2020-10-26 NOTE — Lactation Note (Signed)
This note was copied from a baby's chart. Lactation Consultation Note  Patient Name: Theresa Lambert RCBUL'A Date: 10/26/2020   Age:37 hours  Attempted to visit with mom, but baby went to NICU. L&D RN Dahlia Client reported that mom doesn't want to see lactation at this point. LC services will be offered again at a later time.    Maternal Data    Feeding    LATCH Score                    Lactation Tools Discussed/Used    Interventions    Discharge    Consult Status      Hareem Surowiec Venetia Constable 10/26/2020, 12:36 AM

## 2020-10-26 NOTE — Lactation Note (Signed)
This note was copied from a baby's chart. Lactation Consultation Note  Patient Name: Theresa Lambert GEZMO'Q Date: 10/26/2020 Reason for consult: Follow-up assessment Age:37 hours  LC Follow Up Visit:  Called RN to follow up with pumping.  RN is planning on working with mother and setting up the DEBP.  Per RN, mother went to NICU today to visit her baby.     Maternal Data    Feeding    LATCH Score Latch: Grasps breast easily, tongue down, lips flanged, rhythmical sucking.  Audible Swallowing: A few with stimulation  Type of Nipple: Everted at rest and after stimulation  Comfort (Breast/Nipple): Soft / non-tender  Hold (Positioning): No assistance needed to correctly position infant at breast.  LATCH Score: 9   Lactation Tools Discussed/Used    Interventions    Discharge    Consult Status Consult Status: Follow-up Date: 10/27/20 Follow-up type: In-patient    Dora Sims 10/26/2020, 12:39 PM

## 2020-10-26 NOTE — Lactation Note (Addendum)
This note was copied from a baby's chart. Lactation Consultation Note  Patient Name: Theresa Lambert ZOXWR'U Date: 10/26/2020 Reason for consult: Initial assessment;Early term 37-38.6wks;NICU baby;Infant < 6lbs Age:37 hours   LC Initial Visit:  Attempted to visit with mother, however, she was asleep.  Will return later today; NICU booklet left at bedside.  Per night shift RN, mother wants to begin pumping, but not until later today.  Informed day shift RN that she can call me for pump set up if desired.  NICU booklet left at bedside.   Maternal Data    Feeding Mother's Current Feeding Choice: Breast Milk Nipple Type: Nfant Extra Slow Flow (gold)  LATCH Score                    Lactation Tools Discussed/Used    Interventions    Discharge    Consult Status Consult Status: Follow-up Date: 10/26/20 Follow-up type: In-patient    Taleyah Hillman R Lars Jeziorski 10/26/2020, 7:24 AM

## 2020-10-26 NOTE — Discharge Summary (Signed)
Postpartum Discharge Summary  Date of Service updated 10/27/20     Patient Name: Theresa Lambert DOB: July 14, 1986 MRN: [id number]  Date of admission: 10/25/2020 Delivery date:10/26/2020  Delivering provider: Janet Berlin  Date of discharge: 10/27/2020  Admitting diagnosis: IUGR (intrauterine growth restriction) affecting care of mother [O36.5990] Intrauterine pregnancy: [redacted]w[redacted]d    Secondary diagnosis:  Active Problems:   Encounter for induction of labor   Vaginal delivery  Additional problems: none    Discharge diagnosis: Term Pregnancy Delivered                                              Post partum procedures:none Augmentation: AROM, Pitocin and IP Foley Complications: None  Hospital course: Induction of Labor With Vaginal Delivery   35y.o. yo [id number] admitted to the hospital 10/25/2020 for induction of labor.  Indication for induction: severe fetal growth restriction.  Patient had an uncomplicated labor course as follows: Membrane Rupture Time/Date: 10:51 PM ,10/25/2020   Delivery Method:Vaginal, Spontaneous  Episiotomy: None  Lacerations:  2nd degree  Details of delivery can be found in separate delivery note.  Patient had a routine postpartum course. Patient is discharged home 10/27/20.  Newborn Data: Birth date:10/26/2020  Birth time:12:03 AM  Gender:Female  Living status:Living  Apgars:9 ,9  Weight:1960 g   Magnesium Sulfate received: No BMZ received: No Rhophylac:N/A MMR:N/A T-DaP:Given prenatally Flu: No Transfusion:No  Physical exam  Vitals:   10/26/20 1951 10/27/20 0624 10/27/20 0820 10/27/20 0821  BP: (!) 95/59 (!) 94/56 (!) 94/51   Pulse: 67 67 66   Resp: 17 17    Temp: 98.5 F (36.9 C) 98.2 F (36.8 C)  98.5 F (36.9 C)  TempSrc: Oral Oral    SpO2:  100%  99%  Weight:      Height:       General: alert, cooperative and no distress  CV: RRR Lungs: CTAB Abd: soft and non-tender Lochia: appropriate Uterine Fundus: firm Incision:  N/A DVT Evaluation: No evidence of DVT seen on physical exam. Labs: Lab Results  Component Value Date   WBC 6.4 10/25/2020   HGB 11.1 (L) 10/25/2020   HCT 31.2 (L) 10/25/2020   MCV 90.2 10/25/2020   PLT 233 10/25/2020   CMP Latest Ref Rng & Units 07/01/2020  Glucose 70 - 99 mg/dL 154(H)  BUN 6 - 20 mg/dL 9  Creatinine 0.44 - 1.00 mg/dL 0.53  Sodium 135 - 145 mmol/L 138  Potassium 3.5 - 5.1 mmol/L 3.2(L)  Chloride 98 - 111 mmol/L 106  CO2 22 - 32 mmol/L 25  Calcium 8.9 - 10.3 mg/dL 9.0  Total Protein 6.5 - 8.1 g/dL 7.1  Total Bilirubin 0.3 - 1.2 mg/dL 0.4  Alkaline Phos 38 - 126 U/L 49  AST 15 - 41 U/L 19  ALT 0 - 44 U/L 17   Edinburgh Score: No flowsheet data found.   After visit meds:  Allergies as of 10/27/2020   No Known Allergies     Medication List    STOP taking these medications   butalbital-acetaminophen-caffeine 50-325-40 MG tablet Commonly known as: FIORICET     TAKE these medications   ibuprofen 600 MG tablet Commonly known as: ADVIL Take 1 tablet (600 mg total) by mouth every 6 (six) hours as needed.   prenatal vitamin w/FE, FA 27-1 MG Tabs  tablet Take 1 tablet by mouth daily at 12 noon.        Discharge home in stable condition Infant Feeding: Breast Infant Disposition:NICU  Discharge instruction: per After Visit Summary and Postpartum booklet. Activity: Advance as tolerated. Pelvic rest for 6 weeks.  Diet: routine diet Future Appointments: Future Appointments  Date Time Provider [place]  12/07/2020  1:15 PM Rasch, Artist Pais, NP [facility]   Follow up Visit:  [facility] for [place] [facility] Follow up in 5 week(s).   Specialty: Obstetrics and Gynecology Contact information: [place] [id number] [phone]               Please schedule this patient for a Virtual postpartum visit in 6 weeks with the following provider: Any  provider. Additional Postpartum F/U:n/a  High Risk pregnancy complicated by: severe fetal growth restriction Delivery mode:  Vaginal, Spontaneous  Anticipated Birth Control:  Unsure   10/27/2020 Annalee Genta, DO

## 2020-10-26 NOTE — Progress Notes (Signed)
Spoke to patient with help of video interpreter "Christian [phone]".  Reviewed how patient is feeling this morning, plan of care for the day, visiting baby in NICU, and ordering meals.

## 2020-10-27 MED ORDER — IBUPROFEN 600 MG PO TABS: 600.0000 mg | ORAL_TABLET | Freq: Four times a day (QID) | ORAL | 0 refills | Status: DC | PRN

## 2020-10-27 NOTE — Progress Notes (Signed)
Patient screened out for psychosocial assessment since none of the following apply: °Psychosocial stressors documented in mother or baby's chart °Gestation less than 32 weeks °Code at delivery  °Infant with anomalies °Please contact the Clinical Social Worker if specific needs arise, by MOB's request, or if MOB scores greater than 9/yes to question 10 on Edinburgh Postpartum Depression Screen. ° °Anitra Doxtater Boyd-Gilyard, MSW, LCSW °Clinical Social Work °[phone] °  °

## 2020-10-27 NOTE — Discharge Instructions (Signed)
Parto vaginal, cuidados de puerperio Postpartum Care After Vaginal Delivery La siguiente informacin ofrece una gua sobre cmo cuidarse desde el momento en que nazca su beb y hasta 6 a 12 semanas despus del parto (perodo del posparto). Si tiene problemas o preguntas, pngase en contacto con su mdico para obtener instrucciones ms especficas. Siga estas instrucciones en su casa: Hemorragia vaginal  Es normal tener un poco de hemorragia vaginal (loquios) despus del parto. Use un apsito sanitario para el sangrado y secrecin. ? Durante la primera semana despus del parto, la cantidad y el aspecto de los loquios a menudo es similar a los del perodo menstrual. ? Durante las siguientes semanas disminuir gradualmente hasta convertirse en una secrecin seca amarronada o amarillenta. ? En la mayora de las mujeres, los loquios se detienen completamente entre 4 a 6semanas despus del parto, pero esto puede variar.  Cambie los apsitos sanitarios con frecuencia. Observe si hay cambios en el flujo, como: ? Aumento repentino en el volumen. ? Cambio en el color. ? Cogulos de sangre grandes.  Si expulsa un cogulo de sangre por la vagina, gurdelo y llame al mdico. No deseche los cogulos de sangre por el inodoro antes de hablar con su mdico.  No use tampones ni se haga duchas vaginales hasta que el mdico la autorice.  Si no est amamantando, volver a tener su perodo entre 6 y 8 semanas despus del parto. Si solamente alimenta al beb con leche materna, podra no volver a tener su perodo hasta que deje de amamantar. Cuidados perineales  Mantenga la zona entre la vagina y el ano (perineo) limpia y seca. Utilice apsitos o aerosoles analgsicos y cremas, como se lo hayan indicado.  Si le hicieron un corte quirrgico en el perineo (episiotoma) o tuvo un desgarro, controle la zona para detectar signos de infeccin hasta que sane. Est atenta a los siguientes signos: ? Aumento del  enrojecimiento, la hinchazn o el dolor. ? Presenta lquido o sangre que supura del corte o desgarro. ? Calor. ? Pus o mal olor.  Es posible que le den una botella rociadora para que use en lugar de limpiarse el rea con papel higinico despus de usar el bao. Seque la zona dando golpecitos suaves.  Para aliviar el dolor causado por una episiotoma, un desgarro o venas hinchadas en el ano (hemorroides), tome un bao de asiento tibio 2 o 3 veces por da. En un bao de asiento, el agua tibia solamente debe llegar hasta las caderas y cubrir las nalgas.   Cuidado de las mamas  En los primeros das despus del parto, las mamas pueden sentirse pesadas, llenas e incmodas (congestin mamaria). Tambin puede escaparse leche de sus senos. Pdale al mdico que le sugiera formas de aliviar el malestar.  Si est amamantando: ? Use un sostn que sujete las mamas y ajuste bien. Utilice protectores mamarios para absorber la leche que se filtre. ? Mantenga los pezones secos y limpios. Aplique cremas y ungentos como se lo hayan indicado. ? Puede tener contracciones uterinas cada vez que amamante durante varias semanas despus del parto. Esto ayuda a que el tero vuelva a su tamao normal. ? Si tiene algn problema con la lactancia materna, infrmelo al mdico o a un asesor en lactancia.  Si no est amamantando: ? Evite tocarse las mamas. No extraiga (saque) leche materna. Al hacerlo, podran producir ms leche. ? Use un sostn que le proporcione el ajuste correcto y compresas fras para reducir la hinchazn. Intimidad y sexualidad    Pregntele al mdico cundo puede retomar la actividad sexual. Esto puede depender de lo siguiente: ? Su riesgo de sufrir infecciones. ? La rapidez con la que est sanando. ? Su comodidad y deseo de retomar la actividad sexual.  Despus del parto, puede quedar embarazada incluso si no ha tenido todava su perodo. Hable con el mdico acerca de los mtodos de control de la  natalidad (mtodos anticonceptivos) o planificacin familiar si desea tener embarazos en el futuro. Medicamentos  Use los medicamentos de venta libre y los recetados solamente como se lo haya indicado el mdico.  Tome un laxante de venta libre para ayudar con las deposiciones como se lo haya indicado el mdico.  Si le recetaron un antibitico, tmelo como se lo haya indicado el mdico. No deje de tomar el antibitico aunque comience a sentirse mejor.  Revise todos los medicamentos con receta anteriores y actuales para comprobar la posible transferencia a la leche materna. Actividad  Retome sus actividades normales de a poco segn lo indicado por el mdico.  Descanse todo lo que pueda. Tome siestas mientras el beb duerme. Comida y bebida  Beba suficiente lquido como para mantener la orina de color amarillo plido.  Para ayudar a prevenir o aliviar el estreimiento, coma alimentos ricos en fibra todos los das.  Elija una alimentacin saludable para ayudar a la lactancia o los objetivos de prdida de peso.  Tome sus vitaminas prenatales hasta que su mdico le indique que deje de hacerlo.   Recomendaciones/consejos generales  No consuma ningn producto que contenga nicotina o tabaco. Estos productos incluyen cigarrillos, tabaco para mascar y aparatos de vapeo, como los cigarrillos electrnicos. Si necesita ayuda para dejar de fumar, consulte al mdico.  No beba alcohol, especialmente si est amamantando.  No tome medicamentos o frmacos que no se le receten, especialmente si est en perodo de lactancia.  Visite al mdico para un control de posparto dentro de las primeras 3 a 6 semanas despus del parto.  Realice una visita posparto integral a ms tardar 12 semanas despus del parto.  Asista a todas las visitas de seguimiento para usted y su beb. Comunquese con un mdico si:  Siente tristeza o preocupacin de forma inusual.  Las mamas se ponen rojas, le duelen o se  endurecen.  Tiene fiebre u otros signos de infeccin.  Tiene sangrado que est empapando una compresa por hora o tiene cogulos de sangre.  Siente un dolor de cabeza intenso que no se alivia o tiene cambios en la visin.  Tiene nuseas y vmitos y no puede comer o beber nada durante 24horas. Solicite ayuda de inmediato si:  Tiene dolor en el pecho o dificultad para respirar.  Tiene un dolor repentino e intenso en la pierna.  Tiene una convulsin o se desmaya.  Tiene pensamientos acerca de lastimarse a usted misma o a su beb. Si alguna vez siente que puede lastimarse o lastimar a otras personas, o tiene pensamientos de poner fin a su vida, busque ayuda de inmediato. Dirjase al servicio de urgencias ms cercano o:  Comunquese con el servicio de emergencias de su localidad (911 en los [place]).  National Suicide Prevention Lifeline (Lnea Telefnica Nacional para la Prevencin del Suicidio) al 1-800-273-8255. Esta lnea de asistencia al suicida est abierta las 24 horas del da.  Enve un mensaje de texto a la lnea para casos de crisis al 741741 (en los [place] Resumen  El perodo de tiempo despus el parto y hasta 6 a 12 semanas despus   del parto se denomina perodo posparto.  Asista a todas las visitas de seguimiento para usted y su beb.  Revise todos los medicamentos con receta anteriores y actuales para comprobar la posible transferencia a la leche materna.  Pngase en contacto con un mdico si se siente inusualmente triste o preocupada durante el perodo posparto. Esta informacin no tiene como fin reemplazar el consejo del mdico. Asegrese de hacerle al mdico cualquier pregunta que tenga. Document Revised: 05/14/2020 Document Reviewed: 04/21/2020 Elsevier Patient Education  2021 Elsevier Inc.  

## 2020-10-28 LAB — SURGICAL PATHOLOGY

## 2020-11-03 ENCOUNTER — Ambulatory Visit: Payer: Self-pay

## 2020-11-03 NOTE — Lactation Note (Signed)
This note was copied from a baby's chart. Lactation Consultation Note  Patient Name: Theresa Lambert HBZJI'R Date: 11/03/2020 Reason for consult: Infant < 6lbs;NICU baby;Follow-up assessment;Early term 101-38.6wks Age:37 days  Family came in as [facility] was leaving the NICU, offered assistance with latch and mom agreed to do STS with baby. LC took baby to the left breast in cross cradle position and he was able to latch, but latch could be deeper, baby's mouth is just really small. He fed on and off for 14 minutes with mom having multiple letdowns and audible swallows throghout the feeding, praised her for her efforts.  Noticed that left side (where baby fed) was also softer at the end of the feeding. NICU RN Era Bumpers voiced that baby was in the IDF algorythm during the weekend but it was d/c on Monday due to weight loss and NICU staff started gavaging all feedings at full volume again.  Mom comes every day at 8 pm and she's been putting baby to breast about 3 times/evening, she spends the night at the hospital with baby and leaves the next morning at 8 am. Mom reported she hasn't been pumping consistently, she got about 75 ml on her last pumping session and noticed that her supply has decreased, she used to get nearly 4 oz.  Reviewed supply/demand, pumping schedule, lactogenesis II/II, benefits of breastmilk for NICU babies and AC/PC weights. Lactation will call NICU RN to coordinate an AC/PC weight for this baby, since IDF algorithm Vs actual transfer wasn't consistent. Baby is currently on breastmilk and Similac 24 calorie formula.  Feeding plan:  1. Encouraged mom to start pumping every 3 hours, ideally 8 pumping sessions/24 hours 2. Mom has DEBP for hospital use, she'll also pump at the hospital after feedings at the breast 3. She'll continue putting baby to breast at lib, but she understands that baby will continue getting gavage feedings until NICU staff revise feeding plan 4. This baby is a good  candidate for AC/PC weights, please reach out to NICU RN to coordinate for an 8 pm or 11 pm feeding  FOB present and supportive. Parents reported all questions and concerns were answered, they're both aware of LC OP services and will call PRN.   Maternal Data    Feeding Mother's Current Feeding Choice: Breast Milk and Formula Nipple Type: Dr. Levert Feinstein Preemie  LATCH Score Latch: Repeated attempts needed to sustain latch, nipple held in mouth throughout feeding, stimulation needed to elicit sucking reflex.  Audible Swallowing: Spontaneous and intermittent (on and off with compressions, baby would slow down at times)  Type of Nipple: Everted at rest and after stimulation  Comfort (Breast/Nipple): Soft / non-tender  Hold (Positioning): Assistance needed to correctly position infant at breast and maintain latch.  LATCH Score: 8   Lactation Tools Discussed/Used Tools: Pump;Flanges Flange Size: 24 Breast pump type: Double-Electric Breast Pump Pump Education: Setup, frequency, and cleaning Reason for Pumping: ETI < 5 lbs in NICU Pumping frequency: q 3 hours (but mom is only pumping once/day) Pumped volume: 75 mL  Interventions Interventions: Breast feeding basics reviewed;Assisted with latch;Skin to skin;Breast massage;Hand express;Breast compression;DEBP;Adjust position;Support pillows  Discharge Pump: DEBP  Consult Status Consult Status: Follow-up Date: 11/04/20 (needs an AC/PC weight) Follow-up type: In-patient    Mykia Holton Venetia Constable 11/03/2020, 8:38 PM

## 2020-11-03 NOTE — Lactation Note (Signed)
This note was copied from a baby's chart. Lactation Consultation Note  Patient Name: Theresa Lambert APOLI'D Date: 11/03/2020   Age:37 days  LC came to the NICU for 8 pm appt with mom, she's Spanish speaking, but mom is not here yet. NICU RN Era Bumpers reported to [facility] that mom usually comes here between 6-7 pm and stays overnight, but she hasn't come yet.  Asked NICU RN to call lactation if mom comes in before 23 hours, which is the end of shift of this LC.   Maternal Data    Feeding Nipple Type: Dr. Cline Crock  [facility] Score                    Lactation Tools Discussed/Used    Interventions    Discharge    Consult Status      [name] Venetia Constable 11/03/2020, 8:03 PM

## 2020-11-04 ENCOUNTER — Ambulatory Visit: Payer: Self-pay

## 2020-11-04 NOTE — Lactation Note (Signed)
This note was copied from a baby's chart. Lactation Consultation Note  Patient Name: Theresa Lambert ZGYFV'C Date: 11/04/2020   Age:37 days  Spoke to NICU RN [id number], mother is not here here, just FOB in the room; unable to do AC/PC weight. FOB is the one who provides the transportation for mom; asked him if mom would be able to make it for an AC/PC weight on Saturday night at 8 pm with NICU [facility]. Appt is written down in the NICU booklet for May 16th at 8 pm.  Maternal Data    Feeding    LATCH Score                    Lactation Tools Discussed/Used    Interventions    Discharge    Consult Status      Alysse Rathe Venetia Constable 11/04/2020, 10:14 PM

## 2020-11-07 ENCOUNTER — Ambulatory Visit: Payer: Self-pay

## 2020-11-07 NOTE — Lactation Note (Signed)
This note was copied from a baby's chart. Lactation Consultation Note LC to room for ac/pc weighted breastfeeding. Baby bf for about 9 minutes with occasional audible swallows. Overall, he was sleepy and needed stimulation to continue. He removed from the 1st breast and from the 2nd breast before falling asleep.  Total intake was .   Mother breastfeeds in the NICU at night and pumps 2x during the day while she is away from infant. Today her yield for those 2 pumpings was . Her overall milk volume is likely in the low to normal range.   RN to f/u with provider to determine gavage amount for this feeding and readiness for IDF-2.  Patient Name: Theresa Lambert PIRJJ'O Date: 11/07/2020 Reason for consult: NICU baby;Follow-up assessment;Other (Comment) (ac/pc weighted feeding) Age:37 days  Feeding Nipple Type: Dr. Levert Feinstein Preemie  LATCH Score Latch: Grasps breast easily, tongue down, lips flanged, rhythmical sucking.  Audible Swallowing: A few with stimulation  Type of Nipple: Everted at rest and after stimulation  Comfort (Breast/Nipple): Soft / non-tender  Hold (Positioning): No assistance needed to correctly position infant at breast.  LATCH Score: 9  Consult Status Consult Status: Follow-up Follow-up type: In-patient   Elder Negus, MA IBCLC 11/07/2020, 8:48 PM

## 2020-11-14 ENCOUNTER — Ambulatory Visit: Payer: Self-pay

## 2020-11-14 NOTE — Lactation Note (Signed)
This note was copied from a baby's chart. Lactation Consultation Note LC to room for visit. Video interpreter assisted. Infant on breast when LC arrived and in process of d/c today. Parents feel comfortable with feeding poc at home. They had no further questions/concerns and are aware of community resources.   Patient Name: Theresa Lambert MQKMM'N Date: 11/14/2020 Reason for consult: NICU baby;Follow-up assessment;Other (Comment) (d/c) Age:37 wk.o.  Feeding Mother's Current Feeding Choice: Breast Milk   Interventions Interventions: Adjust position;Support pillows;Position options   Consult Status Consult Status: Follow-up Follow-up type: In-patient   Elder Negus, MA IBCLC 11/14/2020, 2:00 PM

## 2020-12-07 ENCOUNTER — Ambulatory Visit: Payer: Self-pay | Admitting: Obstetrics and Gynecology

## 2022-05-09 ENCOUNTER — Encounter: Payer: Self-pay | Admitting: Student

## 2022-05-09 ENCOUNTER — Inpatient Hospital Stay (HOSPITAL_COMMUNITY)
Admission: AD | Admit: 2022-05-09 | Discharge: 2022-05-10 | Disposition: A | Discharge status: Home / Self Care | Payer: Self-pay | Attending: Obstetrics and Gynecology | Admitting: Obstetrics and Gynecology

## 2022-05-09 ENCOUNTER — Inpatient Hospital Stay (HOSPITAL_COMMUNITY): Payer: Self-pay

## 2022-05-09 DIAGNOSIS — O26891 Other specified pregnancy related conditions, first trimester: Secondary | ICD-10-CM | POA: Insufficient documentation

## 2022-05-09 DIAGNOSIS — Z3491 Encounter for supervision of normal pregnancy, unspecified, first trimester: Secondary | ICD-10-CM

## 2022-05-09 DIAGNOSIS — R109 Unspecified abdominal pain: Secondary | ICD-10-CM | POA: Insufficient documentation

## 2022-05-09 DIAGNOSIS — O209 Hemorrhage in early pregnancy, unspecified: Secondary | ICD-10-CM | POA: Insufficient documentation

## 2022-05-09 DIAGNOSIS — Z3A01 Less than 8 weeks gestation of pregnancy: Secondary | ICD-10-CM | POA: Insufficient documentation

## 2022-05-09 LAB — URINALYSIS, ROUTINE W REFLEX MICROSCOPIC
Bacteria, UA: NONE SEEN
Bilirubin Urine: NEGATIVE
Glucose, UA: NEGATIVE mg/dL
Ketones, ur: NEGATIVE mg/dL
Leukocytes,Ua: NEGATIVE
Nitrite: NEGATIVE
Protein, ur: NEGATIVE mg/dL
Specific Gravity, Urine: 1.009 (ref 1.005–1.030)
pH: 7 (ref 5.0–8.0)

## 2022-05-09 LAB — POCT PREGNANCY, URINE: Preg Test, Ur: POSITIVE — AB

## 2022-05-09 LAB — WET PREP, GENITAL
Clue Cells Wet Prep HPF POC: NONE SEEN
Sperm: NONE SEEN
Trich, Wet Prep: NONE SEEN
WBC, Wet Prep HPF POC: >=10 — AB (ref <10)
Yeast Wet Prep HPF POC: NONE SEEN

## 2022-05-09 NOTE — MAU Note (Signed)
.  Theresa Lambert is a 36 y.o. at Unknown here in MAU reporting: Pt states that Monday 05/02/2022 pt found out she was pregnant. She had gone to the doctors office because she had felt Cramping in lower abdomen and pain with urination. Pt reports that since then she has been bleeding on and off.  Pt also reports that she was diagnosed with a UTI. Pt was prescribed antibiotics and is on her third day.  No recent intercourse, NO LOF. Onset of complaint: since last Monday 05/02/2022.  Pain score: 8/10 pain score lower abdomen.  Vitals:   05/09/22 2210  BP: 111/64  Pulse: 76  Resp: 16  Temp: 97.8 F (36.6 C)  SpO2: 97%      Lab orders placed from triage: UA

## 2022-05-09 NOTE — MAU Provider Note (Signed)
History     [id number]  Arrival date and time: 05/09/22 2127    Chief Complaint  Patient presents with   Vaginal Bleeding     HPI Theresa Lambert is a 36 y.o. at [redacted]w[redacted]d who presents for vaginal bleeding. Reports intermittent vaginal spotting since last Monday. Bleeding is pink & only sees it when she wipes. Went to a clinic a few days ago & was diagnosed with a  UTI. Started taking antibiotics on Saturday. Endorses dysuria for the last few weeks. Abdominal cramping throughout lower abdomen. Denies fever, n/v, hematuria, vaginal discharge, or vaginal irritation.    OB History     Gravida  5   Para  4   Term  4   Preterm  0   AB  0   Living  4      SAB  0   IAB  0   Ectopic  0   Multiple  0   Live Births  4           Past Medical History:  Diagnosis Date   Anemia    Gastritis    Headache     Past Surgical History:  Procedure Laterality Date   NO PAST SURGERIES      Family History  Problem Relation Age of Onset   Asthma Son     No Known Allergies  No current facility-administered medications on file prior to encounter.   Current Outpatient Medications on File Prior to Encounter  Medication Sig Dispense Refill   amoxicillin (AMOXIL) 500 MG capsule Take 500 mg by mouth 3 (three) times daily.     ibuprofen (ADVIL) 600 MG tablet Take 1 tablet (600 mg total) by mouth every 6 (six) hours as needed. 30 tablet 0   prenatal vitamin w/FE, FA (PRENATAL 1 + 1) 27-1 MG TABS tablet Take 1 tablet by mouth daily at 12 noon. 30 tablet 6     ROS Pertinent positives and negative per HPI, all others reviewed and negative  Physical Exam   BP 102/63 (BP Location: Right Arm)   Pulse 77   Temp 97.8 F (36.6 C) (Oral)   Resp 16   Wt 66 kg   LMP 04/03/2022 (Approximate)   SpO2 96%   BMI 28.42 kg/m   Patient Vitals for the past 24 hrs:  BP Temp Temp src Pulse Resp SpO2 Weight  05/10/22 0054 102/63 -- -- 77 16 96 % --  05/09/22 2210 111/64 97.8 F (36.6  C) Oral 76 16 97 % 66 kg    Physical Exam Vitals and nursing note reviewed.  Constitutional:      General: She is not in acute distress.    Appearance: Normal appearance.  Eyes:     General: No scleral icterus.    Conjunctiva/sclera: Conjunctivae normal.     Pupils: Pupils are equal, round, and reactive to light.  Pulmonary:     Effort: Pulmonary effort is normal. No respiratory distress.  Abdominal:     General: Abdomen is flat.     Tenderness: There is no abdominal tenderness. There is no right CVA tenderness, left CVA tenderness, guarding or rebound.  Skin:    General: Skin is warm and dry.  Neurological:     Mental Status: She is alert.      Labs Results for orders placed or performed during the hospital encounter of 05/09/22 (from the past 24 hour(s))  Urinalysis, Routine w reflex microscopic Urine, Clean Catch     Status: Abnormal  Collection Time: 05/09/22  9:53 PM  Result Value Ref Range   Color, Urine STRAW (A) YELLOW   APPearance CLEAR CLEAR   Specific Gravity, Urine 1.009 1.005 - 1.030   pH 7.0 5.0 - 8.0   Glucose, UA NEGATIVE NEGATIVE mg/dL   Hgb urine dipstick MODERATE (A) NEGATIVE   Bilirubin Urine NEGATIVE NEGATIVE   Ketones, ur NEGATIVE NEGATIVE mg/dL   Protein, ur NEGATIVE NEGATIVE mg/dL   Nitrite NEGATIVE NEGATIVE   Leukocytes,Ua NEGATIVE NEGATIVE   RBC / HPF 0-5 0 - 5 RBC/hpf   WBC, UA 0-5 0 - 5 WBC/hpf   Bacteria, UA NONE SEEN NONE SEEN   Squamous Epithelial / LPF 0-5 0 - 5  Pregnancy, urine POC     Status: Abnormal   Collection Time: 05/09/22  9:54 PM  Result Value Ref Range   Preg Test, Ur POSITIVE (A) NEGATIVE  Wet prep, genital     Status: Abnormal   Collection Time: 05/09/22 11:08 PM  Result Value Ref Range   Yeast Wet Prep HPF POC NONE SEEN NONE SEEN   Trich, Wet Prep NONE SEEN NONE SEEN   Clue Cells Wet Prep HPF POC NONE SEEN NONE SEEN   WBC, Wet Prep HPF POC >=10 (A) <10   Sperm NONE SEEN     Imaging US OB LESS THAN 14 WEEKS  WITH OB TRANSVAGINAL  Result Date: 05/10/2022 CLINICAL DATA:  Vaginal bleeding EXAM: OBSTETRIC <14 WK [place] AND TRANSVAGINAL OB US TECHNIQUE: Both transabdominal and transvaginal ultrasound examinations were performed for complete evaluation of the gestation as well as the maternal uterus, adnexal regions, and pelvic cul-de-sac. Transvaginal technique was performed to assess early pregnancy. COMPARISON:  None Available. FINDINGS: Intrauterine gestational sac: Single Yolk sac:  Visualized. Embryo:  Visualized. Cardiac Activity: Visualized. Heart Rate: 133 bpm MSD:   mm    w     d CRL:  7.9 mm   6 w   5 d                  [place] EDC: 12/28/2022 Subchorionic hemorrhage:  None visualized. Maternal uterus/adnexae: Normal appearance of the right ovary. The left ovary was not well visualized due to bowel gas and patient pain though appeared grossly normal. No free fluid. IMPRESSION: Single intrauterine gestation with crown rump length of 7.9 mm corresponding with 6 week 5 day gestation. Cardiac activity visualized at 133 beats per minute. No unexpected findings. Electronically Signed   By: Minerva Fester M.D.   On: 05/10/2022 00:19    MAU Course  Procedures Lab Orders         Wet prep, genital         Culture, OB Urine         Urinalysis, Routine w reflex microscopic Urine, Clean Catch         Pregnancy, urine POC    No orders of the defined types were placed in this encounter.  Imaging Orders         US OB LESS THAN 14 WEEKS WITH OB TRANSVAGINAL     MDM Per paperwork patient brought with her, HCG the other day was 14,000 - deferred labs at this time. RH positive on chart.   Ultrasound shows live IUP measuring [redacted]w[redacted]d, EDD updated  Negative wet prep  U/a collected - will send for culture to determine if patient on appropriate antibiotics since dysuria has continued. She is afebrile & has no CVA tenderness.  Assessment and Plan   1.  Vaginal bleeding in pregnancy, first trimester   2. Normal IUP  (intrauterine pregnancy) on prenatal ultrasound, first trimester   3. [redacted] weeks gestation of pregnancy    -Urine culture pending -Start prenatal care -Bleeding precautions  Video Spanish interpreter used for this encounter  Judeth Horn, NP 05/10/22 12:56 AM

## 2022-05-10 DIAGNOSIS — O209 Hemorrhage in early pregnancy, unspecified: Secondary | ICD-10-CM

## 2022-05-10 DIAGNOSIS — Z3A01 Less than 8 weeks gestation of pregnancy: Secondary | ICD-10-CM

## 2022-05-10 LAB — CULTURE, OB URINE
Culture: NO GROWTH
Special Requests: NORMAL

## 2022-05-10 LAB — GC/CHLAMYDIA PROBE AMP (~~LOC~~) NOT AT ARMC
Chlamydia: NEGATIVE
Comment: NEGATIVE
Comment: NORMAL
Neisseria Gonorrhea: NEGATIVE

## 2022-05-25 IMAGING — US US MFM UA CORD DOPPLER
1 series · 14 of 28 positions shown · non-contrast
Comparison: none

[Series 1: us mfm ua cord doppler · 43 acquisitions, 14 frames shown]
[im 2/43]
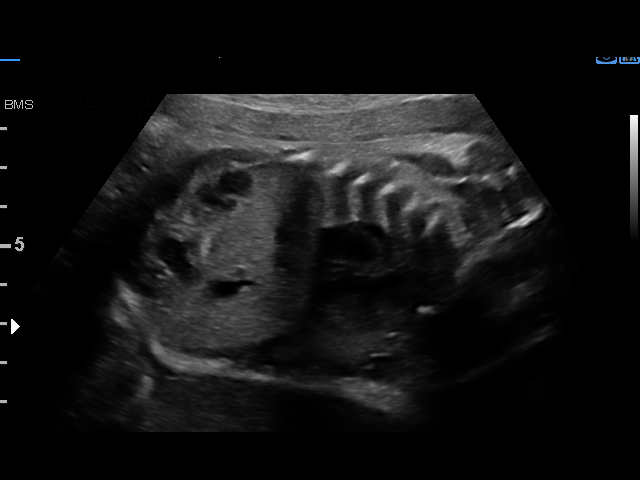
[im 5/43]
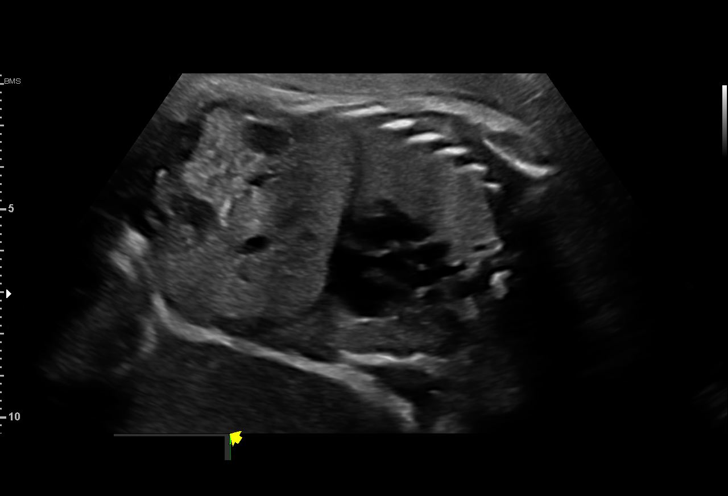
[im 8/43]
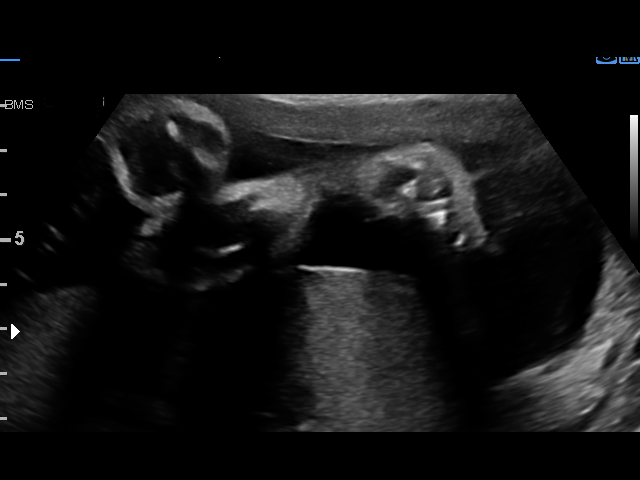
[im 11/43]
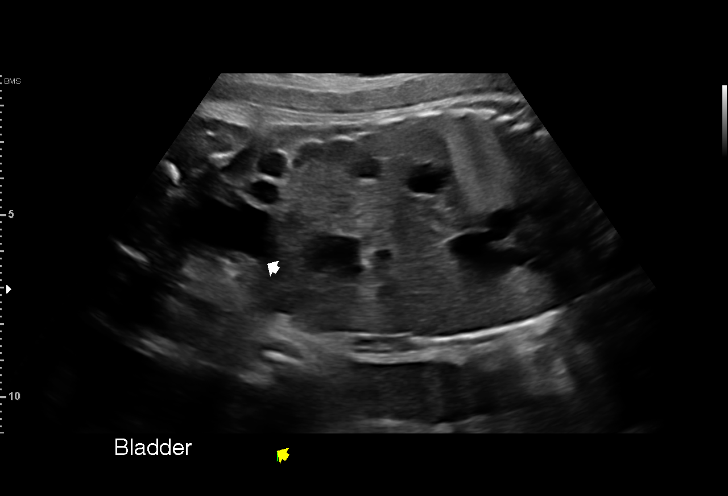
[im 15/43]
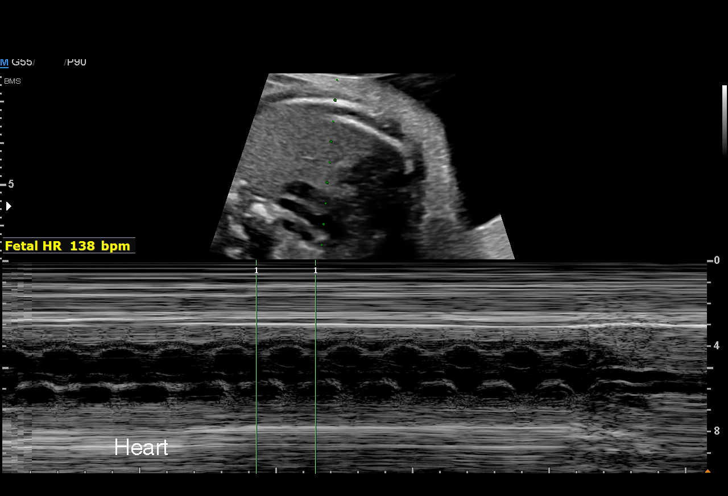
[im 18/43]
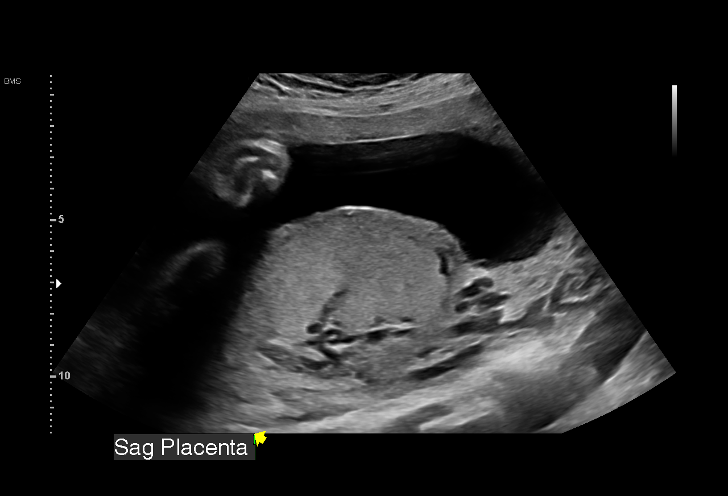
[im 21/43]
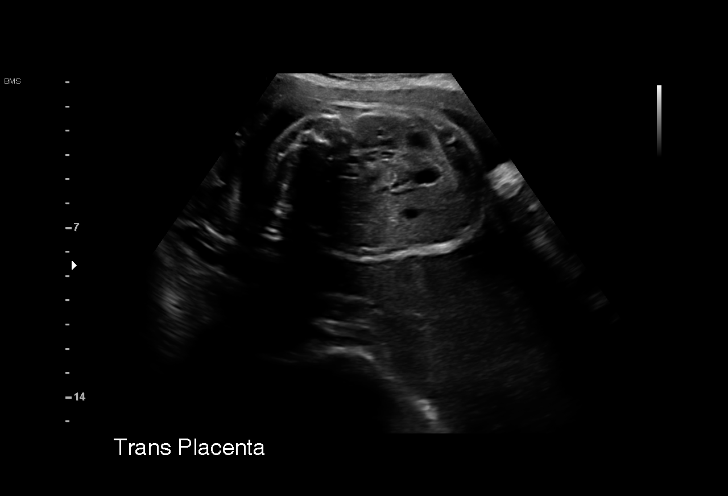
[im 24/43]
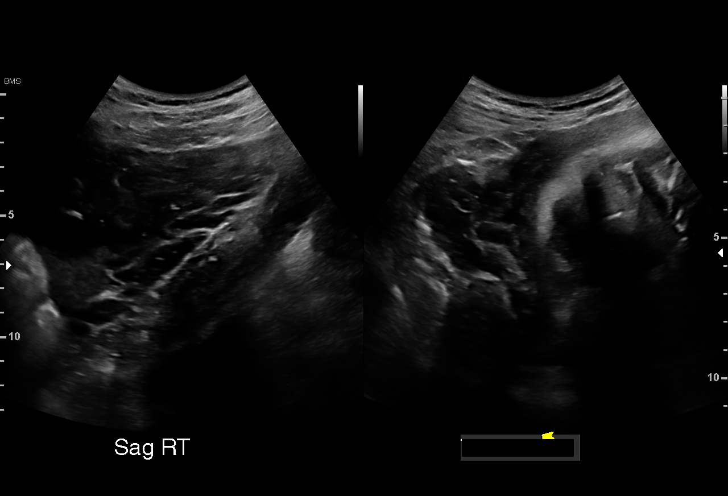
[im 27/43]
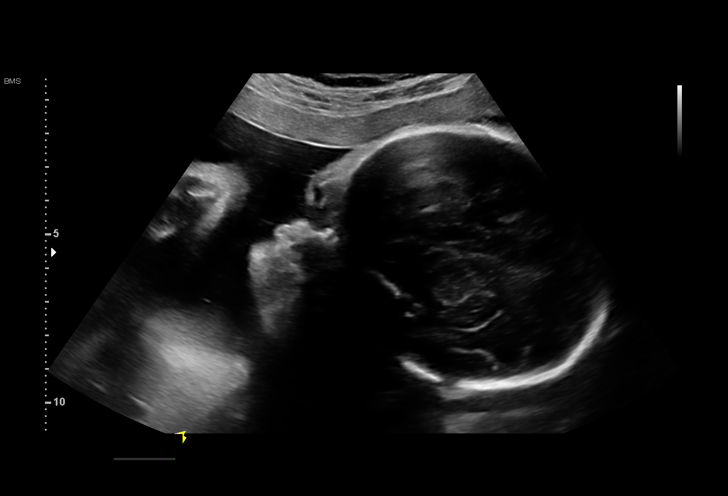
[im 30/43]
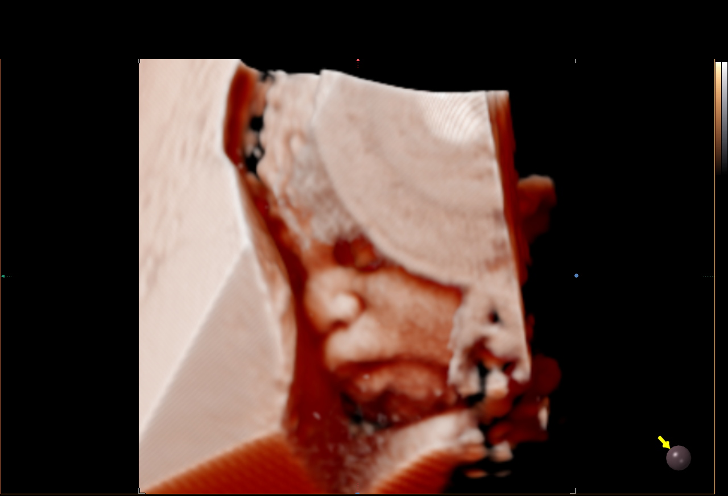
[im 33/43]
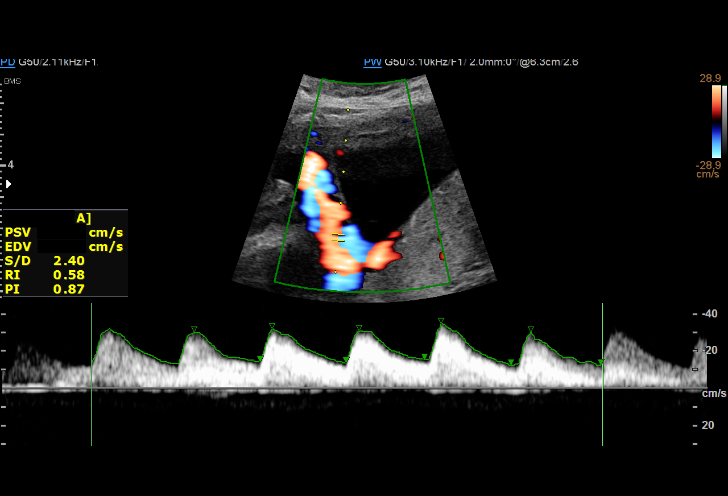
[im 36/43]
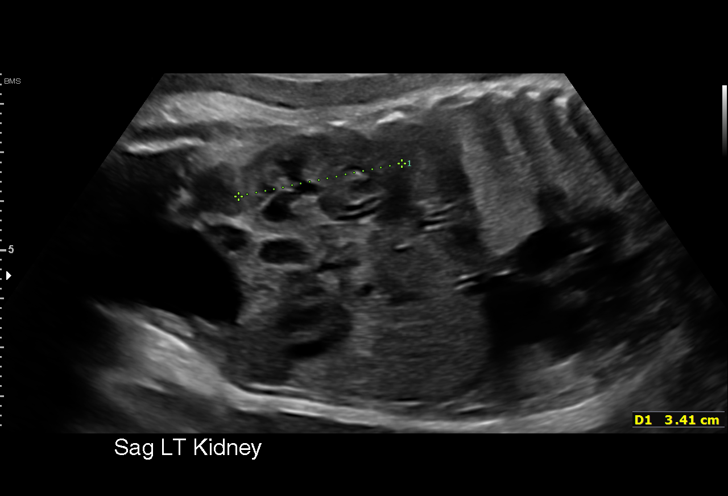
[im 39/43]
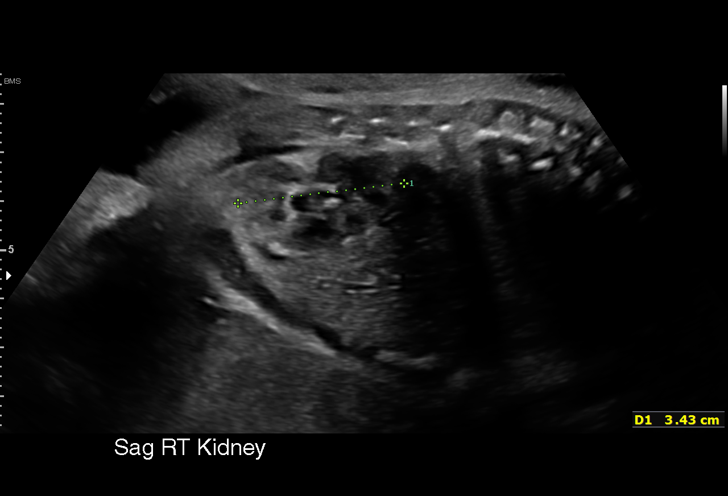
[im 43/43]
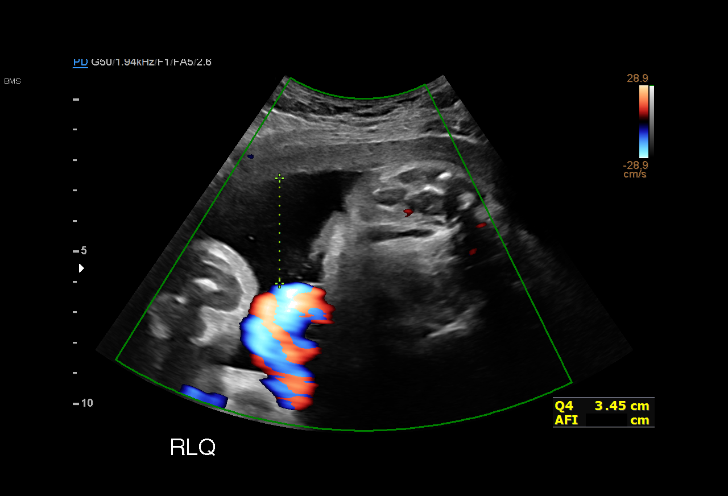

[14 of 28 positions shown; findings below may reference images not displayed]

W/NONSTRESS

Indications

 Encounter for other antenatal screening
 follow-up
 Maternal care for known or suspected poor
 fetal growth, third trimester, not applicable or
 unspecified IUGR
 Advanced maternal age multigravida 35+,
 third trimester
 Maternal care for known or suspected poor
 fetal growth, third trimester, not applicable or
 unspecified IUGR
 Advanced maternal age multigravida 35+,
 third trimester
 33 weeks gestation of pregnancy
Fetal Evaluation

 Num Of Fetuses:         1
 Fetal Heart Rate(bpm):  138
 Cardiac Activity:       Observed
 Presentation:           Cephalic
 Placenta:               Posterior
 P. Cord Insertion:      Previously Visualized
 Amniotic Fluid
 AFI FV:      Within normal limits

 AFI Sum(cm)     %Tile       Largest Pocket(cm)
 13.96           48

 RUQ(cm)       RLQ(cm)       LUQ(cm)        LLQ(cm)

Biophysical Evaluation

 Amniotic F.V:   Pocket => 2 cm             F. Tone:        Observed
 F. Movement:    Observed                   Score:          [DATE]
 F. Breathing:   Observed
Biometry

 LV:        1.9  mm
OB History

 Gravidity:    4         Term:   3        Prem:   0        SAB:   0
 TOP:          0       Ectopic:  0        Living: 3
Gestational Age

 LMP:           33w 1d        Date:  02/09/20                 EDD:   11/15/20
 Best:          33w 1d     Det. By:  LMP  (02/09/20)          EDD:   11/15/20
Anatomy

 Cranium:               Previously seen        Aortic Arch:            Previously seen
 Cavum:                 Previously seen        Ductal Arch:            Previously seen
 Ventricles:            Appears normal         Diaphragm:              Appears normal
 Choroid Plexus:        Previously seen        Stomach:                Appears normal, left
                                                                       sided
 Cerebellum:            Previously seen        Abdomen:                Appears normal
 Posterior Fossa:       Previously seen        Abdominal Wall:         Previously seen
 Nuchal Fold:           Previously seen        Cord Vessels:           Previously seen
 Face:                  Orbits and profile     Kidneys:                Appear normal
                        previously seen
 Lips:                  Previously seen        Bladder:                Appears normal
 Thoracic:              Previously seen        Spine:                  Previously seen
 Heart:                 Appears normal         Upper Extremities:      Previously seen
                        (4CH, axis, and
                        situs)
 RVOT:                  Previously seen        Lower Extremities:      Previously seen
 LVOT:                  Previously seen
Doppler - Fetal Vessels

 Umbilical Artery
  S/D     %tile      RI    %tile                             ADFV    RDFV
   2.4       37    0.58       41                                No      No

Cervix Uterus Adnexa
 Cervix
 Not visualized (advanced GA >70wks)

 Uterus
 No abnormality visualized.

 Right Ovary
 Not visualized.

 Left Ovary
 Not visualized.

 Cul De Sac
 No free fluid seen.

 Adnexa
 No adnexal mass visualized.
Impression

 Severe fetal growth restriction.  Patient returned for antenatal
 testing.  On ultrasound performed 2 weeks ago, the
 estimated fetal weight was at the 1st percentile.

 On today's ultrasound, amniotic fluid is normal and good fetal
 activity seen.  Antenatal testing is reassuring.  Umbilical
 artery Doppler showed normal forward diastolic flow.  NST is
 reactive.  BPP [DATE].

 Blood pressure today at her office is 106/54 mmHg.

 We reassured the patient of the findings.

Recommendations

 -Fetal growth assessment next week.
 -Continue weekly BPP, NST and UA Doppler till delivery.
                 Albertho, Boicky

## 2022-06-28 ENCOUNTER — Telehealth: Payer: Self-pay

## 2022-07-11 ENCOUNTER — Encounter: Payer: Self-pay | Admitting: Medical

## 2022-07-25 NOTE — L&D Delivery Note (Signed)
OB/GYN Faculty Practice Delivery Note  Theresa Lambert is a 37 y.o. [id number] s/p VD at [redacted]w[redacted]d. She was admitted for IOL FGR.   ROM: 1h 68m with clear fluid GBS Status:  Negative/-- (05/06 1430) Maximum Maternal Temperature: 98.56F  Labor Progress: Initial SVE: 3.5/60/0. She then progressed to complete.   Delivery Date/Time: 12/07/22 at 2241 Delivery: Called to room and patient was complete and pushing. Head delivered direct OA. No nuchal cord present. Shoulder and body delivered in usual fashion. Infant with spontaneous cry, placed on mother's abdomen, dried and stimulated. Cord clamped x 2 after 1-minute delay, and cut by FOB. Cord blood drawn. Placenta delivered spontaneously with gentle cord traction. Fundus firm with massage and Pitocin. Labia, perineum, vagina, and cervix inspected with 1st degree laceration, repaired in usual fashion.  Baby Weight: pending  Placenta: 3 vessel, intact. Sent to L&D Complications: None Lacerations: as above EBL: 52 mL Analgesia: Epidural   Infant:  APGAR (1 MIN): 9   APGAR (5 MINS): 10    [name], DO OB Family Medicine Fellow, [facility] for [employer], [facility] 12/07/2022, 11:00 PM

## 2022-07-26 ENCOUNTER — Telehealth: Payer: Self-pay

## 2022-07-27 ENCOUNTER — Encounter: Payer: Self-pay | Admitting: Obstetrics & Gynecology

## 2022-07-28 LAB — OB RESULTS CONSOLE PLATELET COUNT: Platelets: 274

## 2022-07-28 LAB — OB RESULTS CONSOLE ANTIBODY SCREEN: Antibody Screen: NEGATIVE

## 2022-07-28 LAB — OB RESULTS CONSOLE HGB/HCT, BLOOD
HCT: 32 (ref 29–41)
Hemoglobin: 11.3

## 2022-07-28 LAB — OB RESULTS CONSOLE HEPATITIS B SURFACE ANTIGEN: Hepatitis B Surface Ag: NEGATIVE

## 2022-07-28 LAB — OB RESULTS CONSOLE ABO/RH: RH Type: POSITIVE

## 2022-07-28 LAB — OB RESULTS CONSOLE RUBELLA ANTIBODY, IGM: Rubella: IMMUNE

## 2022-07-28 LAB — HEPATITIS C ANTIBODY: HCV Ab: NEGATIVE

## 2022-07-28 LAB — OB RESULTS CONSOLE RPR: RPR: NONREACTIVE

## 2022-07-28 LAB — URINE CULTURE
Drug Screen, Urine: NEGATIVE
Glucose 1 Hour: 146
Urine Culture, OB: NEGATIVE

## 2022-07-28 LAB — OB RESULTS CONSOLE HIV ANTIBODY (ROUTINE TESTING): HIV: NONREACTIVE

## 2022-08-02 LAB — GLUCOSE, 3 HOUR GESTATIONAL

## 2022-08-15 ENCOUNTER — Encounter: Payer: Self-pay | Admitting: *Deleted

## 2022-08-15 DIAGNOSIS — O0990 Supervision of high risk pregnancy, unspecified, unspecified trimester: Secondary | ICD-10-CM | POA: Insufficient documentation

## 2022-08-15 DIAGNOSIS — Z87898 Personal history of other specified conditions: Secondary | ICD-10-CM

## 2022-08-15 DIAGNOSIS — O09529 Supervision of elderly multigravida, unspecified trimester: Secondary | ICD-10-CM | POA: Insufficient documentation

## 2022-08-15 DIAGNOSIS — Z789 Other specified health status: Secondary | ICD-10-CM

## 2022-08-18 ENCOUNTER — Other Ambulatory Visit (HOSPITAL_COMMUNITY)
Admission: RE | Admit: 2022-08-18 | Discharge: 2022-08-18 | Disposition: A | Discharge status: Home / Self Care | Payer: Self-pay | Source: Ambulatory Visit | Attending: Family Medicine | Admitting: Family Medicine

## 2022-08-18 ENCOUNTER — Ambulatory Visit (INDEPENDENT_AMBULATORY_CARE_PROVIDER_SITE_OTHER): Payer: Self-pay

## 2022-08-18 VITALS — Wt 149.9 lb

## 2022-08-18 DIAGNOSIS — N898 Other specified noninflammatory disorders of vagina: Secondary | ICD-10-CM

## 2022-08-18 DIAGNOSIS — Z87898 Personal history of other specified conditions: Secondary | ICD-10-CM

## 2022-08-18 DIAGNOSIS — Z3A21 21 weeks gestation of pregnancy: Secondary | ICD-10-CM

## 2022-08-18 DIAGNOSIS — O09529 Supervision of elderly multigravida, unspecified trimester: Secondary | ICD-10-CM

## 2022-08-18 DIAGNOSIS — O26892 Other specified pregnancy related conditions, second trimester: Secondary | ICD-10-CM | POA: Insufficient documentation

## 2022-08-18 DIAGNOSIS — Z789 Other specified health status: Secondary | ICD-10-CM

## 2022-08-18 DIAGNOSIS — O0990 Supervision of high risk pregnancy, unspecified, unspecified trimester: Secondary | ICD-10-CM

## 2022-08-18 DIAGNOSIS — O0992 Supervision of high risk pregnancy, unspecified, second trimester: Secondary | ICD-10-CM

## 2022-08-18 DIAGNOSIS — O09522 Supervision of elderly multigravida, second trimester: Secondary | ICD-10-CM

## 2022-08-18 NOTE — Progress Notes (Signed)
New OB Intake  I explained I am completing New OB Intake today. We discussed EDD of 12/29/2022 that is based on first trimester ultrasound. Pt is G5/P4. I reviewed her allergies, medications, Medical/Surgical/OB history, and appropriate screenings. I informed her of [facility] services. [facility] information placed in AVS. Based on history, this is a high risk pregnancy.  Patient Active Problem List   Diagnosis Date Noted   Supervision of high risk pregnancy, antepartum 08/15/2022   AMA (advanced maternal age) multigravida 35+ 08/15/2022   Language barrier 08/15/2022   History of low birth weight 08/15/2022    Concerns addressed today Patient reports that she has been seen at the [facility] and was referred to [facility] for low birth weight.  Obtained records from [facility].  Per chart review patient is up to date on all lab work.  OB Records scanned under media.    Delivery Plans Plans to deliver at [facility]. Patient given information for [facility] website for more information about [facility] and [place]. Patient is not interested in water birth. Offered upcoming OB visit with CNM to discuss further.  MyChart/Babyscripts MyChart access verified. I explained pt will have some visits in office and some virtually. Babyscripts instructions given and order placed. Patient verifies receipt of registration text/e-mail. Account successfully created and app downloaded.  Blood Pressure Cuff/Weight Scale Patient is self-pay; explained patient will be given BP cuff at first prenatal appt. Explained after first prenatal appt pt will check weekly and document in 50. Patient does not have weight scale; declines to track weight weekly in Babyscripts.  Anatomy US Patient reports that she has an ultrasound scheduled with [facility] on 08/22/22 at 0915.  Will contact [place] when results are available.   Labs Patient had all routine labs drawn at [facility].  Records under media.    COVID Vaccine Patient has had COVID  vaccine.   Is patient a CenteringPregnancy candidate?  Patient states that she will think about and let us know if she decides to join.     Is patient a Mom+Baby Combined Care candidate?  Not a candidate   If accepted, Mom+Baby staff notified  Social Determinants of Health Food Insecurity: Patient denies food insecurity. WIC Referral: Patient is interested in referral to [facility].  Transportation: Patient denies transportation needs. Childcare: Discussed no children allowed at ultrasound appointments. Offered childcare services; patient declines childcare services at this time.  First visit review I reviewed new OB appt with patient. Explained pt will be seen by Clayton Lefort, MD at first visit; encounter routed to appropriate provider. Explained that patient will be seen by pregnancy navigator following visit with provider.   Verdell Carmine, RN 08/18/2022  2:22 PM

## 2022-08-19 LAB — CERVICOVAGINAL ANCILLARY ONLY
Bacterial Vaginitis (gardnerella): NEGATIVE
Candida Glabrata: NEGATIVE
Candida Vaginitis: NEGATIVE
Chlamydia: NEGATIVE
Comment: NEGATIVE
Comment: NEGATIVE
Comment: NEGATIVE
Comment: NEGATIVE
Comment: NEGATIVE
Comment: NORMAL
Neisseria Gonorrhea: NEGATIVE
Trichomonas: NEGATIVE

## 2022-08-25 ENCOUNTER — Encounter: Payer: Self-pay | Admitting: Family Medicine

## 2022-08-25 ENCOUNTER — Ambulatory Visit (INDEPENDENT_AMBULATORY_CARE_PROVIDER_SITE_OTHER): Payer: Self-pay | Admitting: Family Medicine

## 2022-08-25 VITALS — BP 107/72 | HR 84 | Wt 150.2 lb

## 2022-08-25 DIAGNOSIS — O0992 Supervision of high risk pregnancy, unspecified, second trimester: Secondary | ICD-10-CM

## 2022-08-25 DIAGNOSIS — O365920 Maternal care for other known or suspected poor fetal growth, second trimester, not applicable or unspecified: Secondary | ICD-10-CM

## 2022-08-25 DIAGNOSIS — O09522 Supervision of elderly multigravida, second trimester: Secondary | ICD-10-CM

## 2022-08-25 DIAGNOSIS — Z87898 Personal history of other specified conditions: Secondary | ICD-10-CM

## 2022-08-25 DIAGNOSIS — O365930 Maternal care for other known or suspected poor fetal growth, third trimester, not applicable or unspecified: Secondary | ICD-10-CM

## 2022-08-25 DIAGNOSIS — Z789 Other specified health status: Secondary | ICD-10-CM

## 2022-08-25 DIAGNOSIS — O0990 Supervision of high risk pregnancy, unspecified, unspecified trimester: Secondary | ICD-10-CM

## 2022-08-25 DIAGNOSIS — Z3A22 22 weeks gestation of pregnancy: Secondary | ICD-10-CM

## 2022-08-25 NOTE — Patient Instructions (Signed)
Eleccin del mtodo anticonceptivo Contraception Choices La anticoncepcin, o los mtodos anticonceptivos, hace referencia a los mtodos o dispositivos que evitan el embarazo. Mtodos hormonales  Implante anticonceptivo Un implante anticonceptivo consiste en un tubo delgado de plstico que contiene una hormona que evita el embarazo. Es diferente de un dispositivo intrauterino (DIU). Un mdico lo inserta en la parte superior del brazo. Los implantes pueden ser eficaces durante un mximo de 3 aos. Inyecciones de progestina sola Las inyecciones de progestina sola contienen progestina, una forma sinttica de la hormona progesterona. Un mdico las administra cada 3 meses. Pldoras anticonceptivas Las pldoras anticonceptivas son pastillas que contienen hormonas que evitan el embarazo. Deben tomarse una vez al da, preferentemente a la misma hora cada da. Se necesita una receta para utilizar este mtodo anticonceptivo. Parche anticonceptivo El parche anticonceptivo contiene hormonas que evitan el embarazo. Se coloca en la piel, debe cambiarse una vez a la semana durante tres semanas y debe retirarse en la cuarta semana. Se necesita una receta para utilizar este mtodo anticonceptivo. Anillo vaginal Un anillo vaginal contiene hormonas que evitan el embarazo. Se coloca en la vagina durante tres semanas y se retira en la cuarta semana. Luego se repite el proceso con un anillo nuevo. Se necesita una receta para utilizar este mtodo anticonceptivo. Anticonceptivo de emergencia Los anticonceptivos de emergencia son mtodos para evitar un embarazo despus de tener sexo sin proteccin. Vienen en forma de pldora y pueden tomarse hasta 5 das despus de tener sexo. Funcionan mejor cuando se toman lo ms pronto posible luego de tener sexo. La mayora de los anticonceptivos de emergencia estn disponibles sin receta mdica. Este mtodo no debe utilizarse como el nico mtodo anticonceptivo. Mtodos de  barrera  Condn masculino Un condn masculino es una vaina delgada que se coloca sobre el pene durante el sexo. Los condones evitan que el esperma ingrese en el cuerpo de la mujer. Pueden utilizarse con un una sustancia que mata a los espermatozoides (espermicida) para aumentar la efectividad. Deben desecharse despus de un uso. Condn femenino Un condn femenino es una vaina blanda y holgada que se coloca en la vagina antes de tener sexo. El condn evita que el esperma ingrese en el cuerpo de la mujer. Deben desecharse despus de un uso. Diafragma Un diafragma es una barrera blanda con forma de cpula. Se inserta en la vagina antes del sexo, junto con un espermicida. El diafragma bloquea el ingreso de esperma en el tero, y el espermicida mata a los espermatozoides. El diafragma debe permanecer en la vagina durante 6 a 8 horas despus de tener sexo y debe retirarse en el plazo de las 24 horas. Un diafragma es recetado y colocado por un mdico. Debe reemplazarse cada 1 a 2 aos, despus de dar a luz, de aumentar ms de 15lb (6.8kg) y de una ciruga plvica. Capuchn cervical Un capuchn cervical es una copa redonda y blanda de ltex o plstico que se coloca en el cuello uterino. Se inserta en la vagina antes del sexo, junto con un espermicida. Bloquea el ingreso del esperma en el tero. El capuchn debe permanecer en el lugar durante 6 a 8 horas despus de tener sexo y debe retirarse en el plazo de las 48 horas. Un capuchn cervical debe ser recetado y colocado por un mdico. Debe reemplazarse cada 2aos. Esponja Una esponja es una pieza blanda y circular de espuma de poliuretano que contiene espermicida. La esponja ayuda a bloquear el ingreso de esperma en el tero, y el espermicida   mata a los espermatozoides. Para utilizarla, debe humedecerla e insertarla en la vagina. Debe insertarse antes de tener sexo, debe permanecer dentro al menos durante 6 horas despus de tener sexo y debe retirarse y  desecharse en el plazo de las 30 horas. Espermicidas Los espermicidas son sustancias qumicas que matan o bloquean al esperma y no lo dejan ingresar al cuello uterino y al tero. Vienen en forma de crema, gel, supositorio, espuma o comprimido. Un espermicida debe insertarse en la vagina con un aplicador al menos 10 o 15 minutos antes de tener sexo para dar tiempo a que surta efecto. El proceso debe repetirse cada vez que tenga sexo. Los espermicidas no requieren receta mdica. Anticonceptivos intrauterinos Dispositivo intrauterino (DIU) Un DIU es un dispositivo en forma de T que se coloca en el tero. Existen dos tipos: DIU hormonal.Este tipo contiene progestina, una forma sinttica de la hormona progesterona. Este tipo puede permanecer colocado durante 3 a 5 aos. DIU de cobre.Este tipo est recubierto con un alambre de cobre. Puede permanecer colocado durante 10 aos. Mtodos anticonceptivos permanentes Ligadura de trompas en la mujer En este mtodo, se sellan, atan u obstruyen las trompas de Falopio durante una ciruga para evitar que el vulo descienda hacia el tero. Esterilizacin histeroscpica En este mtodo, se coloca un implante pequeo y flexible dentro de cada trompa de Falopio. Los implantes hacen que se forme un tejido cicatricial en las trompas de Falopio y que las obstruya para que el espermatozoide no pueda llegar al vulo. El procedimiento demora alrededor de 3 meses para que sea efectivo. Debe utilizarse otro mtodo anticonceptivo durante esos 3 meses. Esterilizacin masculina Este es un procedimiento que consiste en atar los conductos que transportan el esperma (vasectoma). Luego del procedimiento, el hombre puede eyacular lquido (semen). Debe utilizarse otro mtodo anticonceptivo durante 3 meses despus del procedimiento. Mtodos de planificacin natural Planificacin familiar natural En este mtodo, la pareja no tiene sexo durante los das en que la mujer podra quedar  embarazada. Mtodo calendario En este mtodo, la mujer realiza un seguimiento de la duracin de cada ciclo menstrual, identifica los das en los que se puede producir un embarazo y no tiene sexo durante esos das. Mtodo de la ovulacin En este mtodo, la pareja evita tener sexo durante la ovulacin. Mtodo sintotrmico Este mtodo implica no tener sexo durante la ovulacin. Normalmente, la mujer comprueba la ovulacin al observar cambios en su temperatura y en la consistencia del moco cervical. Mtodo posovulacin En este mtodo, la pareja espera a que finalice la ovulacin para tener sexo. Dnde buscar ms informacin Centers for Disease Control and Prevention (Centros para el Control y la Prevencin de Enfermedades): www.cdc.gov Resumen La anticoncepcin, o los mtodos anticonceptivos, hace referencia a los mtodos o dispositivos que evitan el embarazo. Los mtodos anticonceptivos hormonales incluyen implantes, inyecciones, pastillas, parches, anillos vaginales y anticonceptivos de emergencia. Los mtodos anticonceptivos de barrera pueden incluir condones masculinos, condones femeninos, diafragmas, capuchones cervicales, esponjas y espermicidas. Existen dos tipos de DIU (dispositivo intrauterino). Un DIU puede colocarse en el tero de una mujer para evitar el embarazo durante 3 a 5 aos. La esterilizacin permanente puede realizarse mediante un procedimiento tanto en los hombres como en las mujeres. Los mtodos de planificacin familiar natural implican no tener sexo durante los das en que la mujer podra quedar embarazada. Esta informacin no tiene como fin reemplazar el consejo del mdico. Asegrese de hacerle al mdico cualquier pregunta que tenga. Document Revised: 02/11/2020 Document Reviewed: 02/11/2020 Elsevier Patient Education    [place].

## 2022-08-25 NOTE — Progress Notes (Signed)
Subjective:   Theresa Lambert is a 37 y.o. G5P4004 at [redacted]w[redacted]d by 6 wk [place] being seen today for her first obstetrical visit.  Her obstetrical history is significant for advanced maternal age and hx of SGA infant in prior pregnancy . Patient does intend to breast feed. Pregnancy history fully reviewed.  Patient reports no complaints.  Transfer from [facility] due to hx of low birth weight infant in prior pregnancy.  HISTORY: OB History  Gravida Para Term Preterm AB Living  5 4 4  0 0 4  SAB IAB Ectopic Multiple Live Births  0 0 0 0 4    # Outcome Date GA Lbr Len/2nd Weight Sex Delivery Anes PTL Lv  5 Current           4 Term 10/26/20 [redacted]w[redacted]d 01:06 / 00:06 4 lb 5.1 oz (1.96 kg) M Vag-Spont Local  LIV     Name: BETTEY, MURAOKA     Apgar1: 9  Apgar5: 9  3 Term 09/23/06 [redacted]w[redacted]d  9 lb 8 oz (4.309 kg) F Vag-Spont None  LIV  2 Term 12/03/02 [redacted]w[redacted]d  8 lb 8 oz (3.856 kg) M Vag-Spont None  LIV  1 Term 08/17/00 [redacted]w[redacted]d  9 lb 8 oz (4.309 kg) M Vag-Spont None  LIV     Last pap smear: No results found for: "DIAGPAP", "HPV", "[place]" 05/14/2020 - NILM  Past Medical History:  Diagnosis Date   Anemia    Gastritis    Headache    Past Surgical History:  Procedure Laterality Date   NO PAST SURGERIES     Family History  Problem Relation Age of Onset   Healthy Mother    Healthy Father    Asthma Son    Social History   Tobacco Use   Smoking status: Never   Smokeless tobacco: Never  Vaping Use   Vaping Use: Never used  Substance Use Topics   Alcohol use: Not Currently   Drug use: Never   No Known Allergies Current Outpatient Medications on File Prior to Visit  Medication Sig Dispense Refill   prenatal vitamin w/FE, FA (PRENATAL 1 + 1) 27-1 MG TABS tablet Take 1 tablet by mouth daily at 12 noon. 30 tablet 6   No current facility-administered medications on file prior to visit.     Exam   Vitals:   08/25/22 1003  BP: 107/72  Pulse: 84  Weight: 150 lb 3.2 oz (68.1 kg)   Fetal  Heart Rate (bpm): 147  System: General: well-developed, well-nourished female in no acute distress   Skin: normal coloration and turgor, no rashes   Neurologic: oriented, normal, negative, normal mood   Extremities: normal strength, tone, and muscle mass, ROM of all joints is normal   HEENT PERRLA, extraocular movement intact and sclera clear, anicteric   Neck supple and no masses   Respiratory:  no respiratory distress      Assessment:   Pregnancy: [id number] Patient Active Problem List   Diagnosis Date Noted   Supervision of high risk pregnancy, antepartum 08/15/2022   AMA (advanced maternal age) multigravida 35+ 08/15/2022   Language barrier 08/15/2022   History of low birth weight 08/15/2022     Plan:  1. Supervision of high risk pregnancy, antepartum BP and FHR normal Initial labs reviewed, all normal and PL updated. Continue prenatal vitamins. Ultrasound discussed; fetal anatomic survey:  [place] results requested from [facility] . Problem list reviewed and updated. The nature of [facility] Faculty  Practice with multiple MDs and other Advanced Practice Providers was explained to patient; also emphasized that residents, students are part of our team. Routine obstetric precautions reviewed.  2. IUGR [place] with EFW <3% Dating is off by 2 days (12/26/22 instead of 12/28/22) but likely would still be IUGR even with that adjustment Will send to MFM for growth scan Is taking daily baby ASA  Return in 4 weeks (on 09/22/2022) for [facility], ob visit.

## 2022-09-16 ENCOUNTER — Other Ambulatory Visit: Payer: Self-pay | Admitting: Maternal & Fetal Medicine

## 2022-09-16 ENCOUNTER — Ambulatory Visit: Payer: Self-pay | Attending: Family Medicine

## 2022-09-16 ENCOUNTER — Other Ambulatory Visit: Payer: Self-pay | Admitting: Family Medicine

## 2022-09-16 ENCOUNTER — Encounter: Payer: Self-pay | Admitting: *Deleted

## 2022-09-16 ENCOUNTER — Ambulatory Visit: Payer: Self-pay | Admitting: *Deleted

## 2022-09-16 VITALS — BP 109/56 | HR 76

## 2022-09-16 DIAGNOSIS — Z3A25 25 weeks gestation of pregnancy: Secondary | ICD-10-CM

## 2022-09-16 DIAGNOSIS — O09292 Supervision of pregnancy with other poor reproductive or obstetric history, second trimester: Secondary | ICD-10-CM

## 2022-09-16 DIAGNOSIS — O365930 Maternal care for other known or suspected poor fetal growth, third trimester, not applicable or unspecified: Secondary | ICD-10-CM

## 2022-09-16 DIAGNOSIS — Z789 Other specified health status: Secondary | ICD-10-CM | POA: Insufficient documentation

## 2022-09-16 DIAGNOSIS — Z87898 Personal history of other specified conditions: Secondary | ICD-10-CM | POA: Insufficient documentation

## 2022-09-16 DIAGNOSIS — O365920 Maternal care for other known or suspected poor fetal growth, second trimester, not applicable or unspecified: Secondary | ICD-10-CM

## 2022-09-16 DIAGNOSIS — O0990 Supervision of high risk pregnancy, unspecified, unspecified trimester: Secondary | ICD-10-CM | POA: Insufficient documentation

## 2022-09-16 DIAGNOSIS — O09522 Supervision of elderly multigravida, second trimester: Secondary | ICD-10-CM

## 2022-09-22 ENCOUNTER — Ambulatory Visit (INDEPENDENT_AMBULATORY_CARE_PROVIDER_SITE_OTHER): Payer: Self-pay | Admitting: Advanced Practice Midwife

## 2022-09-22 ENCOUNTER — Encounter: Payer: Self-pay | Admitting: Family Medicine

## 2022-09-22 ENCOUNTER — Other Ambulatory Visit: Payer: Self-pay

## 2022-09-22 ENCOUNTER — Ambulatory Visit: Payer: Self-pay

## 2022-09-22 ENCOUNTER — Other Ambulatory Visit: Payer: Self-pay | Admitting: General Practice

## 2022-09-22 VITALS — BP 111/72 | HR 87 | Wt 152.0 lb

## 2022-09-22 DIAGNOSIS — Z87898 Personal history of other specified conditions: Secondary | ICD-10-CM

## 2022-09-22 DIAGNOSIS — G44229 Chronic tension-type headache, not intractable: Secondary | ICD-10-CM

## 2022-09-22 DIAGNOSIS — Z3A26 26 weeks gestation of pregnancy: Secondary | ICD-10-CM

## 2022-09-22 DIAGNOSIS — O365920 Maternal care for other known or suspected poor fetal growth, second trimester, not applicable or unspecified: Secondary | ICD-10-CM

## 2022-09-22 DIAGNOSIS — Z789 Other specified health status: Secondary | ICD-10-CM

## 2022-09-22 DIAGNOSIS — O0992 Supervision of high risk pregnancy, unspecified, second trimester: Secondary | ICD-10-CM

## 2022-09-22 DIAGNOSIS — O0990 Supervision of high risk pregnancy, unspecified, unspecified trimester: Secondary | ICD-10-CM

## 2022-09-22 DIAGNOSIS — O09522 Supervision of elderly multigravida, second trimester: Secondary | ICD-10-CM

## 2022-09-22 MED ORDER — CYCLOBENZAPRINE HCL 10 MG PO TABS: 10.0000 mg | ORAL_TABLET | Freq: Three times a day (TID) | ORAL | 1 refills | Status: DC | PRN

## 2022-09-22 NOTE — Progress Notes (Signed)
   PRENATAL VISIT NOTE  Subjective:  Theresa Lambert is a 37 y.o. [id number] at 13w1dbeing seen today for ongoing prenatal care.  She is currently monitored for the following issues for this high-risk pregnancy and has IUGR (intrauterine growth restriction) affecting care of mother; Supervision of high risk pregnancy, antepartum; AMA (advanced maternal age) multigravida 364+ Language barrier; and History of low birth weight on their problem list.  Patient reports headache and light headedness .  Contractions: Not present. Vag. Bleeding: None.  Movement: Present. Denies leaking of fluid.   The following portions of the patient's history were reviewed and updated as appropriate: allergies, current medications, past family history, past medical history, past social history, past surgical history and problem list.   Objective:   Vitals:   09/22/22 1117  BP: 111/72  Pulse: 87  Weight: 152 lb (68.9 kg)    Fetal Status: Fetal Heart Rate (bpm): 140 Fundal Height: 27 cm Movement: Present     General:  Alert, oriented and cooperative. Patient is in no acute distress.  Skin: Skin is warm and dry. No rash noted.   Cardiovascular: Normal heart rate noted  Respiratory: Normal respiratory effort, no problems with respiration noted  Abdomen: Soft, gravid, appropriate for gestational age.  Pain/Pressure: Present     Pelvic: Cervical exam performed in the presence of a chaperone        Extremities: Normal range of motion.  Edema: None  Mental Status: Normal mood and affect. Normal behavior. Normal judgment and thought content.   Assessment and Plan:  Pregnancy: [id number] 237w1d. Poor fetal growth affecting management of mother in second trimester, single or unspecified fetus - Antenatal testing per MFM  2. Supervision of high risk pregnancy, antepartum - Routine care - Recommend TDaP at HD  3. Multigravida of advanced maternal age in second trimester - Per MFM  4. Language barrier - Spanish  interpreter used  5. History of low birth weight - Growth US's per MFM  6. [redacted] weeks gestation of pregnancy   Preterm labor symptoms and general obstetric precautions including but not limited to vaginal bleeding, contractions, leaking of fluid and fetal movement were reviewed in detail with the patient. Please refer to After Visit Summary for other counseling recommendations.   Return in about 2 weeks (around 10/06/2022) for ROB/GTT.  Future Appointments  Date Time Provider De[place]3/07/2022 10:30 AM [facility] NURSE [facility]/07/2022 10:45 AM [facility] US5 [facility]/10/2022  7:30 AM [facility] NURSE [facility]10[facility]20[facility]  7:45 AM [facility]MFC US6 [facility]/01/2023  9:30 AM [facility] NURSE [facility]3/01/2023  9:45 AM [facility]MFC US7 [facility]/13/2024  8:50 AM WMC-WOCA LAB [facility]3/15/2024 10:30 AM WMC-MFC NURSE [facility]3/15/2024 10:45 AM WMC-MFC US6 [facility]/21/2024 10:55 AM BaGriffin BasilMD WMR[facility]M

## 2022-09-22 NOTE — Progress Notes (Signed)
Patient reports daily headaches- feels lightheaded, tylenol helps minimally. Currently has headache rated 8/10

## 2022-09-22 NOTE — Patient Instructions (Addendum)

## 2022-09-23 ENCOUNTER — Ambulatory Visit: Payer: Self-pay | Attending: Maternal & Fetal Medicine

## 2022-09-23 ENCOUNTER — Ambulatory Visit: Payer: Self-pay | Admitting: *Deleted

## 2022-09-23 VITALS — BP 103/54 | HR 85

## 2022-09-23 DIAGNOSIS — Z789 Other specified health status: Secondary | ICD-10-CM | POA: Insufficient documentation

## 2022-09-23 DIAGNOSIS — O0990 Supervision of high risk pregnancy, unspecified, unspecified trimester: Secondary | ICD-10-CM

## 2022-09-23 DIAGNOSIS — Z87898 Personal history of other specified conditions: Secondary | ICD-10-CM

## 2022-09-23 DIAGNOSIS — O09522 Supervision of elderly multigravida, second trimester: Secondary | ICD-10-CM

## 2022-09-23 DIAGNOSIS — O09292 Supervision of pregnancy with other poor reproductive or obstetric history, second trimester: Secondary | ICD-10-CM

## 2022-09-23 DIAGNOSIS — O365920 Maternal care for other known or suspected poor fetal growth, second trimester, not applicable or unspecified: Secondary | ICD-10-CM | POA: Insufficient documentation

## 2022-09-23 DIAGNOSIS — Z3A26 26 weeks gestation of pregnancy: Secondary | ICD-10-CM

## 2022-09-23 DIAGNOSIS — O35BXX0 Maternal care for other (suspected) fetal abnormality and damage, fetal cardiac anomalies, not applicable or unspecified: Secondary | ICD-10-CM

## 2022-09-26 ENCOUNTER — Ambulatory Visit: Payer: Self-pay

## 2022-09-26 ENCOUNTER — Other Ambulatory Visit: Payer: Self-pay

## 2022-09-29 ENCOUNTER — Ambulatory Visit: Payer: Self-pay

## 2022-09-29 ENCOUNTER — Other Ambulatory Visit: Payer: Self-pay

## 2022-10-05 ENCOUNTER — Other Ambulatory Visit: Payer: Self-pay

## 2022-10-05 ENCOUNTER — Other Ambulatory Visit: Payer: Self-pay | Admitting: General Practice

## 2022-10-05 DIAGNOSIS — O0990 Supervision of high risk pregnancy, unspecified, unspecified trimester: Secondary | ICD-10-CM

## 2022-10-06 LAB — CBC
Hematocrit: 32.9 % — ABNORMAL LOW (ref 34.0–46.6)
Hemoglobin: 11.1 g/dL (ref 11.1–15.9)
MCH: 31.5 pg (ref 26.6–33.0)
MCHC: 33.7 g/dL (ref 31.5–35.7)
MCV: 94 fL (ref 79–97)
Platelets: 284 10*3/uL (ref 150–450)
RBC: 3.52 x10E6/uL — ABNORMAL LOW (ref 3.77–5.28)
RDW: 13 % (ref 11.7–15.4)
WBC: 8 10*3/uL (ref 3.4–10.8)

## 2022-10-06 LAB — GLUCOSE TOLERANCE, 2 HOURS W/ 1HR
Glucose, 1 hour: 174 mg/dL (ref 70–179)
Glucose, 2 hour: 161 mg/dL — ABNORMAL HIGH (ref 70–152)
Glucose, Fasting: 88 mg/dL (ref 70–91)

## 2022-10-06 LAB — HIV ANTIBODY (ROUTINE TESTING W REFLEX): HIV Screen 4th Generation wRfx: NONREACTIVE

## 2022-10-06 LAB — RPR: RPR Ser Ql: NONREACTIVE

## 2022-10-07 ENCOUNTER — Ambulatory Visit: Payer: Self-pay

## 2022-10-07 ENCOUNTER — Encounter: Payer: Self-pay | Admitting: Advanced Practice Midwife

## 2022-10-07 DIAGNOSIS — O24410 Gestational diabetes mellitus in pregnancy, diet controlled: Secondary | ICD-10-CM | POA: Insufficient documentation

## 2022-10-07 DIAGNOSIS — O24419 Gestational diabetes mellitus in pregnancy, unspecified control: Secondary | ICD-10-CM | POA: Insufficient documentation

## 2022-10-10 ENCOUNTER — Telehealth: Payer: Self-pay | Admitting: *Deleted

## 2022-10-10 ENCOUNTER — Other Ambulatory Visit: Payer: Self-pay | Admitting: *Deleted

## 2022-10-10 ENCOUNTER — Ambulatory Visit: Payer: Self-pay | Attending: Obstetrics and Gynecology

## 2022-10-10 ENCOUNTER — Ambulatory Visit: Payer: Self-pay

## 2022-10-10 ENCOUNTER — Ambulatory Visit: Payer: Self-pay | Admitting: *Deleted

## 2022-10-10 ENCOUNTER — Ambulatory Visit (HOSPITAL_BASED_OUTPATIENT_CLINIC_OR_DEPARTMENT_OTHER): Payer: Self-pay | Admitting: *Deleted

## 2022-10-10 VITALS — BP 112/58 | HR 80

## 2022-10-10 DIAGNOSIS — O365930 Maternal care for other known or suspected poor fetal growth, third trimester, not applicable or unspecified: Secondary | ICD-10-CM | POA: Insufficient documentation

## 2022-10-10 DIAGNOSIS — Z789 Other specified health status: Secondary | ICD-10-CM | POA: Insufficient documentation

## 2022-10-10 DIAGNOSIS — O24419 Gestational diabetes mellitus in pregnancy, unspecified control: Secondary | ICD-10-CM

## 2022-10-10 DIAGNOSIS — O09293 Supervision of pregnancy with other poor reproductive or obstetric history, third trimester: Secondary | ICD-10-CM

## 2022-10-10 DIAGNOSIS — O0990 Supervision of high risk pregnancy, unspecified, unspecified trimester: Secondary | ICD-10-CM | POA: Insufficient documentation

## 2022-10-10 DIAGNOSIS — O09523 Supervision of elderly multigravida, third trimester: Secondary | ICD-10-CM | POA: Insufficient documentation

## 2022-10-10 DIAGNOSIS — O365920 Maternal care for other known or suspected poor fetal growth, second trimester, not applicable or unspecified: Secondary | ICD-10-CM | POA: Insufficient documentation

## 2022-10-10 DIAGNOSIS — Z87898 Personal history of other specified conditions: Secondary | ICD-10-CM | POA: Insufficient documentation

## 2022-10-10 DIAGNOSIS — O35BXX0 Maternal care for other (suspected) fetal abnormality and damage, fetal cardiac anomalies, not applicable or unspecified: Secondary | ICD-10-CM

## 2022-10-10 DIAGNOSIS — O24410 Gestational diabetes mellitus in pregnancy, diet controlled: Secondary | ICD-10-CM | POA: Insufficient documentation

## 2022-10-10 DIAGNOSIS — Z3A28 28 weeks gestation of pregnancy: Secondary | ICD-10-CM | POA: Insufficient documentation

## 2022-10-10 NOTE — Procedures (Signed)
Theresa Lambert 03-04-1986 [redacted]w[redacted]d  Fetus A Non-Stress Test Interpretation for 10/10/22  Indication: IUGR  Fetal Heart Rate A Mode: External Baseline Rate (A): 140 bpm Variability: Moderate Accelerations: 10 x 10 Decelerations: None Multiple birth?: No  Uterine Activity Mode: Palpation, Toco Contraction Frequency (min): None  Interpretation (Fetal Testing) Nonstress Test Interpretation: Reactive Overall Impression: Reassuring for gestational age Comments: Dr. Annamaria Boots reviewed tracing.

## 2022-10-10 NOTE — Telephone Encounter (Signed)
-----   Message from [place], [place] sent at 10/07/2022  1:22 PM EDT ----- Please notify pt of Dx GDM, need for DM education. Please Rx testing supplies. Needs Spanish interpreter.

## 2022-10-10 NOTE — Telephone Encounter (Signed)
I called patient with Interpreter Julieanne Manson and informed her of results and recommendation. I scheduled for first available education appt for 10/20/22. I verified she is self pay and explained educator will give her meter and supplies at her visit. She voices understanding. Staci Acosta

## 2022-10-13 ENCOUNTER — Ambulatory Visit (INDEPENDENT_AMBULATORY_CARE_PROVIDER_SITE_OTHER): Payer: Self-pay | Admitting: Obstetrics and Gynecology

## 2022-10-13 ENCOUNTER — Other Ambulatory Visit: Payer: Self-pay

## 2022-10-13 VITALS — BP 116/74 | HR 90 | Wt 157.1 lb

## 2022-10-13 DIAGNOSIS — O365930 Maternal care for other known or suspected poor fetal growth, third trimester, not applicable or unspecified: Secondary | ICD-10-CM

## 2022-10-13 DIAGNOSIS — O24410 Gestational diabetes mellitus in pregnancy, diet controlled: Secondary | ICD-10-CM

## 2022-10-13 DIAGNOSIS — Z3A29 29 weeks gestation of pregnancy: Secondary | ICD-10-CM

## 2022-10-13 DIAGNOSIS — O09523 Supervision of elderly multigravida, third trimester: Secondary | ICD-10-CM

## 2022-10-13 DIAGNOSIS — O0990 Supervision of high risk pregnancy, unspecified, unspecified trimester: Secondary | ICD-10-CM

## 2022-10-13 DIAGNOSIS — O0993 Supervision of high risk pregnancy, unspecified, third trimester: Secondary | ICD-10-CM

## 2022-10-13 DIAGNOSIS — Z789 Other specified health status: Secondary | ICD-10-CM

## 2022-10-13 MED ORDER — ACCU-CHEK SOFTCLIX LANCETS MISC: 12 refills | Status: DC

## 2022-10-13 MED ORDER — GLUCOSE BLOOD VI STRP: ORAL_STRIP | 12 refills | Status: DC

## 2022-10-13 MED ORDER — ACCU-CHEK GUIDE W/DEVICE KIT: 1.0000 | PACK | Freq: Four times a day (QID) | 0 refills | Status: DC

## 2022-10-13 NOTE — Patient Instructions (Signed)
Benadryl or unisom para dormir

## 2022-10-13 NOTE — Progress Notes (Signed)
   PRENATAL VISIT NOTE  Subjective:  Theresa Lambert is a 37 y.o. [id number] at [redacted]w[redacted]d being seen today for ongoing prenatal care.  She is currently monitored for the following issues for this high-risk pregnancy and has IUGR (intrauterine growth restriction) affecting care of mother; Supervision of high risk pregnancy, antepartum; AMA (advanced maternal age) multigravida 72+; Language barrier; History of low birth weight; and Diet controlled White classification A1 gestational diabetes mellitus (GDM) on their problem list.  Patient doing well with no acute concerns today. She reports no complaints and difficulty sleeping .  Contractions: Irritability. Vag. Bleeding: None.  Movement: Present. Denies leaking of fluid.   The following portions of the patient's history were reviewed and updated as appropriate: allergies, current medications, past family history, past medical history, past social history, past surgical history and problem list. Problem list updated.  Objective:   Vitals:   10/13/22 1115  BP: 116/74  Pulse: 90  Weight: 157 lb 1.6 oz (71.3 kg)    Fetal Status: Fetal Heart Rate (bpm): 149 Fundal Height: 25 cm Movement: Present     General:  Alert, oriented and cooperative. Patient is in no acute distress.  Skin: Skin is warm and dry. No rash noted.   Cardiovascular: Normal heart rate noted  Respiratory: Normal respiratory effort, no problems with respiration noted  Abdomen: Soft, gravid, appropriate for gestational age.  Pain/Pressure: Present     Pelvic: Cervical exam deferred        Extremities: Normal range of motion.  Edema: None  Mental Status:  Normal mood and affect. Normal behavior. Normal judgment and thought content.   Assessment and Plan:  Pregnancy: [id number] at [redacted]w[redacted]d  1. Supervision of high risk pregnancy, antepartum Continue routine prenatal care  - Blood Glucose Monitoring Suppl (ACCU-CHEK GUIDE) w/Device KIT; 1 Device by Does not apply route 4 (four) times  daily.  Dispense: 1 kit; Refill: 0 - Accu-Chek Softclix Lancets lancets; Use as instructed  Dispense: 100 each; Refill: 12 - glucose blood test strip; Use as instructed  Dispense: 100 each; Refill: 12  2. Diet controlled White classification A1 gestational diabetes mellitus (GDM) Pt has diabetic teaching in about a week  - Blood Glucose Monitoring Suppl (ACCU-CHEK GUIDE) w/Device KIT; 1 Device by Does not apply route 4 (four) times daily.  Dispense: 1 kit; Refill: 0 - Accu-Chek Softclix Lancets lancets; Use as instructed  Dispense: 100 each; Refill: 12 - glucose blood test strip; Use as instructed  Dispense: 100 each; Refill: 12  3. [redacted] weeks gestation of pregnancy   4. Language barrier Live interpreter  5. Poor fetal growth affecting management of mother in third trimester, single or unspecified fetus EFW <2%, pt is followed closely by MFM  47. Multigravida of advanced maternal age in third trimester Followed by MFM  Preterm labor symptoms and general obstetric precautions including but not limited to vaginal bleeding, contractions, leaking of fluid and fetal movement were reviewed in detail with the patient.  Please refer to After Visit Summary for other counseling recommendations.   Return in about 2 weeks (around 10/27/2022) for [facility], in person.   Lynnda Shields, MD Faculty Attending Center for [facility]

## 2022-10-17 ENCOUNTER — Ambulatory Visit: Payer: Self-pay | Admitting: *Deleted

## 2022-10-17 ENCOUNTER — Ambulatory Visit: Payer: Self-pay | Attending: Obstetrics

## 2022-10-17 ENCOUNTER — Ambulatory Visit (HOSPITAL_BASED_OUTPATIENT_CLINIC_OR_DEPARTMENT_OTHER): Payer: Self-pay | Admitting: *Deleted

## 2022-10-17 VITALS — BP 107/40 | HR 74

## 2022-10-17 DIAGNOSIS — O358XX0 Maternal care for other (suspected) fetal abnormality and damage, not applicable or unspecified: Secondary | ICD-10-CM

## 2022-10-17 DIAGNOSIS — Z789 Other specified health status: Secondary | ICD-10-CM

## 2022-10-17 DIAGNOSIS — O24410 Gestational diabetes mellitus in pregnancy, diet controlled: Secondary | ICD-10-CM | POA: Insufficient documentation

## 2022-10-17 DIAGNOSIS — O09523 Supervision of elderly multigravida, third trimester: Secondary | ICD-10-CM

## 2022-10-17 DIAGNOSIS — Z87898 Personal history of other specified conditions: Secondary | ICD-10-CM | POA: Insufficient documentation

## 2022-10-17 DIAGNOSIS — O0990 Supervision of high risk pregnancy, unspecified, unspecified trimester: Secondary | ICD-10-CM

## 2022-10-17 DIAGNOSIS — Z3A29 29 weeks gestation of pregnancy: Secondary | ICD-10-CM

## 2022-10-17 DIAGNOSIS — O09293 Supervision of pregnancy with other poor reproductive or obstetric history, third trimester: Secondary | ICD-10-CM

## 2022-10-17 DIAGNOSIS — O24419 Gestational diabetes mellitus in pregnancy, unspecified control: Secondary | ICD-10-CM | POA: Insufficient documentation

## 2022-10-17 DIAGNOSIS — O365930 Maternal care for other known or suspected poor fetal growth, third trimester, not applicable or unspecified: Secondary | ICD-10-CM | POA: Insufficient documentation

## 2022-10-17 NOTE — Procedures (Signed)
Theresa Lambert April 03, 1986 [redacted]w[redacted]d  Fetus A Non-Stress Test Interpretation for 10/17/22  Indication: IUGR  Fetal Heart Rate A Mode: External Baseline Rate (A): 145 bpm Variability: Moderate Accelerations: 10 x 10 Decelerations: None Multiple birth?: No  Uterine Activity Mode: Palpation, Toco Contraction Frequency (min): None  Interpretation (Fetal Testing) Nonstress Test Interpretation: Reactive Overall Impression: Reassuring for gestational age Comments: Dr. Donalee Citrin reviewed tracing.

## 2022-10-20 ENCOUNTER — Other Ambulatory Visit: Payer: Self-pay

## 2022-10-20 ENCOUNTER — Ambulatory Visit (INDEPENDENT_AMBULATORY_CARE_PROVIDER_SITE_OTHER): Payer: Self-pay | Admitting: Registered"

## 2022-10-20 ENCOUNTER — Encounter: Payer: Self-pay | Attending: Family Medicine | Admitting: Registered"

## 2022-10-20 DIAGNOSIS — Z3A30 30 weeks gestation of pregnancy: Secondary | ICD-10-CM

## 2022-10-20 DIAGNOSIS — O24410 Gestational diabetes mellitus in pregnancy, diet controlled: Secondary | ICD-10-CM

## 2022-10-20 DIAGNOSIS — O365930 Maternal care for other known or suspected poor fetal growth, third trimester, not applicable or unspecified: Secondary | ICD-10-CM | POA: Insufficient documentation

## 2022-10-20 DIAGNOSIS — Z3A29 29 weeks gestation of pregnancy: Secondary | ICD-10-CM | POA: Insufficient documentation

## 2022-10-20 LAB — OB RESULTS CONSOLE GBS: GBS: NEGATIVE

## 2022-10-20 NOTE — Progress Notes (Signed)
Patient was seen for Gestational Diabetes self-management on 10/20/22  Start time 1515 and End time 1623  This patient is accompanied in the office by her spouse.    Estimated due date: 12/28/22; [redacted]w[redacted]d  [employer] Video Interpreter Jesus [id number]  Clinical: Medications: prenatal vitamins, aspirin  Medical History: IUGR (intrauterine growth restriction) affecting care of mother Supervision of high risk pregnancy, antepartum AMA (advanced maternal age) multigravida 35+ Language barrier History of low birth weight  Labs:A1c n/a%     Dietary and Lifestyle History: Pt appears sad about changes she needs to make to diet because she likes sweet bread and coffee, tortillas, rice, fruit.   Pt states she does not get much activity. When patient got up to leave was having pain in lower belly, may benefit from a pregnancy belt.  Pt's spouse was present and seemed very supportive and will make diet changes with her as well as walk with her.  Physical Activity: ADL Stress: not assessed Sleep: "little" ~5 hrs  24 hr Recall:  First Meal: flour tortilla, eggs, beans Snack: Second meal: 2 c spaghetti, 1 tortilla, cheese, tamarindo soda Snack: Third meal: not assessed Snack: Beverages: water, soda, a little coffee with 1/2 Tbs  NUTRITION INTERVENTION  Nutrition education (E-1) on the following topics:   Initial Follow-up  [x]  []  Definition of Gestational Diabetes []  []  Why dietary management is important in controlling blood glucose [x]  []  Effects each nutrient has on blood glucose levels []  []  Simple carbohydrates vs complex carbohydrates []  []  Fluid intake [x]  []  Creating a balanced meal plan [x]  []  Carbohydrate counting  [x]  []  When to check blood glucose levels [x]  []  Proper blood glucose monitoring techniques [x]  []  Effect of stress and stress reduction techniques  [x]  []  Exercise effect on blood glucose levels, appropriate exercise during pregnancy []  []  Importance of limiting  caffeine and abstaining from alcohol and smoking []  []  Medications used for blood sugar control during pregnancy []  []  Hypoglycemia and rule of 15 [x]  []  Postpartum self care  Blood glucose monitor given: [employer] Lot # [id number] Strips Lot [id number]-4 Exp: 2023-11-27 CBG: 153 mg/dL 3 hrs after lunch with spaghetti, 1 tortilla, cheese and small soda  Patient instructed to monitor glucose levels: FBS: 60 - ? 95 mg/dL; 2 hour: ? 120 mg/dL  Patient received handouts: Nutrition Diabetes and Pregnancy Carbohydrate Counting List  Patient will be seen for follow-up as needed.

## 2022-10-24 ENCOUNTER — Ambulatory Visit: Payer: Self-pay | Admitting: *Deleted

## 2022-10-24 ENCOUNTER — Ambulatory Visit: Payer: Self-pay | Attending: Obstetrics

## 2022-10-24 VITALS — BP 100/58 | HR 76

## 2022-10-24 DIAGNOSIS — Z603 Acculturation difficulty: Secondary | ICD-10-CM | POA: Insufficient documentation

## 2022-10-24 DIAGNOSIS — Z3A30 30 weeks gestation of pregnancy: Secondary | ICD-10-CM

## 2022-10-24 DIAGNOSIS — O24410 Gestational diabetes mellitus in pregnancy, diet controlled: Secondary | ICD-10-CM | POA: Insufficient documentation

## 2022-10-24 DIAGNOSIS — Z758 Other problems related to medical facilities and other health care: Secondary | ICD-10-CM | POA: Insufficient documentation

## 2022-10-24 DIAGNOSIS — O35BXX0 Maternal care for other (suspected) fetal abnormality and damage, fetal cardiac anomalies, not applicable or unspecified: Secondary | ICD-10-CM

## 2022-10-24 DIAGNOSIS — O0990 Supervision of high risk pregnancy, unspecified, unspecified trimester: Secondary | ICD-10-CM

## 2022-10-24 DIAGNOSIS — O365930 Maternal care for other known or suspected poor fetal growth, third trimester, not applicable or unspecified: Secondary | ICD-10-CM | POA: Insufficient documentation

## 2022-10-24 DIAGNOSIS — O24419 Gestational diabetes mellitus in pregnancy, unspecified control: Secondary | ICD-10-CM | POA: Insufficient documentation

## 2022-10-24 DIAGNOSIS — O09523 Supervision of elderly multigravida, third trimester: Secondary | ICD-10-CM

## 2022-10-24 DIAGNOSIS — O09293 Supervision of pregnancy with other poor reproductive or obstetric history, third trimester: Secondary | ICD-10-CM

## 2022-10-24 DIAGNOSIS — Z87898 Personal history of other specified conditions: Secondary | ICD-10-CM | POA: Insufficient documentation

## 2022-10-24 NOTE — Procedures (Addendum)
Theresa Lambert 02/25/1986 [redacted]w[redacted]d  Fetus A Non-Stress Test Interpretation for 10/24/22  Indication: IUGR  Fetal Heart Rate A Mode: External Baseline Rate (A): 145 bpm Variability: Moderate Accelerations: 10 x 10, 15 x 15 Decelerations: None Multiple birth?: No  Uterine Activity Mode: Palpation, Toco Contraction Frequency (min): Occas. Contraction Quality: Mild Resting Tone Palpated: Relaxed Resting Time: Adequate  Interpretation (Fetal Testing) Nonstress Test Interpretation: Reactive Overall Impression: Reassuring for gestational age Comments: Dr. Epimenio Sarin reviewed tracing.

## 2022-10-30 ENCOUNTER — Inpatient Hospital Stay (HOSPITAL_COMMUNITY)
Admission: AD | Admit: 2022-10-30 | Discharge: 2022-10-31 | Disposition: A | Discharge status: Home / Self Care | Payer: Self-pay | Attending: Obstetrics & Gynecology | Admitting: Obstetrics & Gynecology

## 2022-10-30 ENCOUNTER — Encounter (HOSPITAL_COMMUNITY): Payer: Self-pay | Admitting: Obstetrics & Gynecology

## 2022-10-30 DIAGNOSIS — Z3A31 31 weeks gestation of pregnancy: Secondary | ICD-10-CM | POA: Insufficient documentation

## 2022-10-30 DIAGNOSIS — O4703 False labor before 37 completed weeks of gestation, third trimester: Secondary | ICD-10-CM | POA: Insufficient documentation

## 2022-10-30 LAB — URINALYSIS, ROUTINE W REFLEX MICROSCOPIC
Bilirubin Urine: NEGATIVE
Glucose, UA: NEGATIVE mg/dL
Hgb urine dipstick: NEGATIVE
Ketones, ur: NEGATIVE mg/dL
Leukocytes,Ua: NEGATIVE
Nitrite: NEGATIVE
Protein, ur: NEGATIVE mg/dL
Specific Gravity, Urine: 1.013 (ref 1.005–1.030)
pH: 6 (ref 5.0–8.0)

## 2022-10-30 MED ORDER — TERBUTALINE SULFATE 1 MG/ML IJ SOLN
0.2500 mg | Freq: Once | INTRAMUSCULAR | Status: AC
  Administered 2022-10-30: 0.25 mg via SUBCUTANEOUS
  Filled 2022-10-30: qty 1

## 2022-10-30 NOTE — MAU Provider Note (Signed)
History     [id number]  Arrival date and time: 10/30/22 1959    Chief Complaint  Patient presents with   Contractions     HPI Theresa Lambert is a 37 y.o. at [redacted]w[redacted]d by 6 week ultrasound who presents for contractions. Reports painful contractions at least 10 times per hour since this afternoon. Can't tell how frequent they are. Denies n/v/d, dysuria, vaginal bleeding, or LOF. Positive fetal movement. Last intercourse was earlier today before symptoms started.    O/Positive/-- (01/04 0000)  OB History     Gravida  5   Para  4   Term  4   Preterm  0   AB  0   Living  4      SAB  0   IAB  0   Ectopic  0   Multiple  0   Live Births  4           Past Medical History:  Diagnosis Date   Anemia    Gastritis    Headache     Past Surgical History:  Procedure Laterality Date   NO PAST SURGERIES      Family History  Problem Relation Age of Onset   Healthy Mother    Healthy Father    Asthma Son     Social History   Socioeconomic History   Marital status: Single    Spouse name: Not on file   Number of children: Not on file   Years of education: Not on file   Highest education level: Not on file  Occupational History   Not on file  Tobacco Use   Smoking status: Never   Smokeless tobacco: Never  Vaping Use   Vaping Use: Never used  Substance and Sexual Activity   Alcohol use: Not Currently   Drug use: Not Currently   Sexual activity: Yes    Birth control/protection: Implant    Comment: pt reports IC 10/30/22  Other Topics Concern   Not on file  Social History Narrative   Not on file   Social Determinants of Health   Financial Resource Strain: Not on file  Food Insecurity: No Food Insecurity (08/18/2022)   Hunger Vital Sign    Worried About Running Out of Food in the Last Year: Never true    Ran Out of Food in the Last Year: Never true  Transportation Needs: No Transportation Needs (08/18/2022)   PRAPARE - Scientist, research (physical sciences) (Medical): No    Lack of Transportation (Non-Medical): No  Physical Activity: Not on file  Stress: Not on file  Social Connections: Not on file  Intimate Partner Violence: Not on file    No Known Allergies  No current facility-administered medications on file prior to encounter.   Current Outpatient Medications on File Prior to Encounter  Medication Sig Dispense Refill   aspirin EC 81 MG tablet Take 81 mg by mouth daily. Swallow whole.     cyclobenzaprine (FLEXERIL) 10 MG tablet Take 1 tablet (10 mg total) by mouth every 8 (eight) hours as needed for muscle spasms. 30 tablet 1   prenatal vitamin w/FE, FA (PRENATAL 1 + 1) 27-1 MG TABS tablet Take 1 tablet by mouth daily at 12 noon. 30 tablet 6   Accu-Chek Softclix Lancets lancets Use as instructed 100 each 12   Blood Glucose Monitoring Suppl (ACCU-CHEK GUIDE) w/Device KIT 1 Device by Does not apply route 4 (four) times daily. 1 kit 0   glucose  blood test strip Use as instructed 100 each 12     ROS Pertinent positives and negative per HPI, all others reviewed and negative  Physical Exam   BP (!) 98/59   Pulse 86   Temp 97.8 F (36.6 C) (Oral)   Resp 17   Ht 5\' 2"  (1.575 m)   Wt 69.9 kg   LMP 04/03/2022 (Approximate)   SpO2 99%   BMI 28.17 kg/m   Patient Vitals for the past 24 hrs:  BP Temp Temp src Pulse Resp SpO2 Height Weight  10/31/22 0011 (!) 98/59 -- -- 86 -- -- -- --  10/31/22 0010 -- -- -- -- -- 99 % -- --  10/31/22 0005 -- -- -- -- -- 99 % -- --  10/31/22 0000 -- -- -- -- -- 99 % -- --  10/30/22 2355 -- -- -- -- -- 99 % -- --  10/30/22 2340 -- -- -- -- -- 100 % -- --  10/30/22 2335 -- -- -- -- -- 99 % -- --  10/30/22 2330 -- -- -- -- -- 100 % -- --  10/30/22 2230 -- -- -- -- -- 98 % -- --  10/30/22 2225 -- -- -- -- -- 98 % -- --  10/30/22 2220 -- -- -- -- -- 100 % -- --  10/30/22 2215 -- -- -- -- -- 100 % -- --  10/30/22 2210 -- -- -- -- -- 99 % -- --  10/30/22 2155 -- -- -- -- -- 100 % --  --  10/30/22 2150 -- -- -- -- -- 100 % -- --  10/30/22 2145 -- -- -- -- -- 99 % -- --  10/30/22 2140 -- -- -- -- -- 100 % -- --  10/30/22 2135 -- -- -- -- -- 100 % -- --  10/30/22 2130 -- -- -- -- -- 100 % -- --  10/30/22 2129 -- -- -- -- -- 100 % -- --  10/30/22 2125 -- -- -- -- -- 100 % -- --  10/30/22 2120 -- -- -- -- -- 99 % -- --  10/30/22 2115 -- -- -- -- -- 100 % -- --  10/30/22 2110 -- -- -- -- -- 99 % -- --  10/30/22 2105 -- -- -- -- -- 99 % -- --  10/30/22 2100 -- -- -- -- -- 99 % -- --  10/30/22 2055 -- -- -- -- -- 99 % -- --  10/30/22 2054 111/65 -- -- 72 -- -- -- --  10/30/22 2050 -- -- -- -- -- 99 % -- --  10/30/22 2045 -- -- -- -- -- 99 % -- --  10/30/22 2025 102/63 97.8 F (36.6 C) Oral 72 17 100 % 5\' 2"  (1.575 m) 69.9 kg    Physical Exam Vitals and nursing note reviewed. Exam conducted with a chaperone present.  Constitutional:      General: She is not in acute distress.    Appearance: Normal appearance. She is well-developed. She is not ill-appearing or diaphoretic.  HENT:     Head: Normocephalic and atraumatic.  Eyes:     General: No scleral icterus.    Pupils: Pupils are equal, round, and reactive to light.  Pulmonary:     Effort: Pulmonary effort is normal. No respiratory distress.  Abdominal:     Tenderness: There is no abdominal tenderness.     Comments: gravid  Skin:    General: Skin is warm and dry.  Neurological:     Mental  Status: She is alert.  Psychiatric:        Behavior: Behavior normal.        Thought Content: Thought content normal.      Cervical Exam Dilation: Closed Effacement (%): Thick Cervical Position: Anterior Station: -3 Exam by:: Judeth HornErin Carlisa Eble NP  FHT Baseline 135, moderate variability, 15x15 accels, no decels Toco: irregular Cat: 1  Labs Results for orders placed or performed during the hospital encounter of 10/30/22 (from the past 24 hour(s))  Urinalysis, Routine w reflex microscopic -Urine, Clean Catch      Status: Abnormal   Collection Time: 10/30/22  8:53 PM  Result Value Ref Range   Color, Urine YELLOW YELLOW   APPearance HAZY (A) CLEAR   Specific Gravity, Urine 1.013 1.005 - 1.030   pH 6.0 5.0 - 8.0   Glucose, UA NEGATIVE NEGATIVE mg/dL   Hgb urine dipstick NEGATIVE NEGATIVE   Bilirubin Urine NEGATIVE NEGATIVE   Ketones, ur NEGATIVE NEGATIVE mg/dL   Protein, ur NEGATIVE NEGATIVE mg/dL   Nitrite NEGATIVE NEGATIVE   Leukocytes,Ua NEGATIVE NEGATIVE    Imaging No results found.  MAU Course  Procedures Lab Orders         Urinalysis, Routine w reflex microscopic -Urine, Clean Catch    Meds ordered this encounter  Medications   terbutaline (BRETHINE) injection 0.25 mg   Imaging Orders  No imaging studies ordered today    MDM moderate  Assessment and Plan   1. Preterm uterine contractions in third trimester, antepartum   2. [redacted] weeks gestation of pregnancy    -patient presents with contractions after intercourse. Unable to collect FFN due to recent SI. Cervix closed & unchanged after 1 plus hour of monitoring. Patient given oral hydration & terbutaline with good response. Discharged home with labor precautions  *[facility] provided Spanish interpreter used for this encounter*  #FWB: reactive nst    Dispo: discharged to home in stable condition.   Discharge Instructions     Discharge patient   Complete by: As directed    Discharge disposition: 01-Home or Self Care   Discharge patient date: 10/30/2022       Judeth HornErin Jermario Kalmar, NP 10/31/22 6:35 AM  Allergies as of 10/31/2022   No Known Allergies      Medication List     TAKE these medications    Accu-Chek Guide w/Device Kit 1 Device by Does not apply route 4 (four) times daily.   Accu-Chek Softclix Lancets lancets Use as instructed   aspirin EC 81 MG tablet Take 81 mg by mouth daily. Swallow whole.   cyclobenzaprine 10 MG tablet Commonly known as: FLEXERIL Take 1 tablet (10 mg total) by mouth every 8 (eight)  hours as needed for muscle spasms.   glucose blood test strip Use as instructed   prenatal vitamin w/FE, FA 27-1 MG Tabs tablet Take 1 tablet by mouth daily at 12 noon.

## 2022-10-30 NOTE — MAU Note (Signed)
.  Theresa Lambert is a 37 y.o. at [redacted]w[redacted]d here in MAU reporting: Pt reports contractions that radiate to her lower back-increased in intensity today at 1400. States they make it difficult to walk due to lower abdominal cramping and pressure in her groin.  States she has yellow vaginal discharge-without odor or itching. Endorses + fetal movement.  Onset of complaint: 1400 Pain score:  Vitals:   10/30/22 2025  BP: 102/63  Pulse: 72  Resp: 17  Temp: 97.8 F (36.6 C)  SpO2: 100%     FHT:to Room 128 Lab orders placed from triage:  UA

## 2022-10-31 ENCOUNTER — Ambulatory Visit: Payer: Self-pay | Attending: Obstetrics

## 2022-10-31 ENCOUNTER — Ambulatory Visit: Payer: Self-pay | Admitting: *Deleted

## 2022-10-31 ENCOUNTER — Other Ambulatory Visit: Payer: Self-pay

## 2022-10-31 ENCOUNTER — Other Ambulatory Visit: Payer: Self-pay | Admitting: Obstetrics

## 2022-10-31 ENCOUNTER — Other Ambulatory Visit: Payer: Self-pay | Admitting: *Deleted

## 2022-10-31 ENCOUNTER — Ambulatory Visit (INDEPENDENT_AMBULATORY_CARE_PROVIDER_SITE_OTHER): Payer: Self-pay | Admitting: Medical

## 2022-10-31 ENCOUNTER — Encounter: Payer: Self-pay | Admitting: Medical

## 2022-10-31 VITALS — BP 109/70 | HR 88 | Wt 154.7 lb

## 2022-10-31 VITALS — BP 101/57 | HR 70

## 2022-10-31 DIAGNOSIS — O0993 Supervision of high risk pregnancy, unspecified, third trimester: Secondary | ICD-10-CM

## 2022-10-31 DIAGNOSIS — O365930 Maternal care for other known or suspected poor fetal growth, third trimester, not applicable or unspecified: Secondary | ICD-10-CM | POA: Insufficient documentation

## 2022-10-31 DIAGNOSIS — Z758 Other problems related to medical facilities and other health care: Secondary | ICD-10-CM

## 2022-10-31 DIAGNOSIS — O0990 Supervision of high risk pregnancy, unspecified, unspecified trimester: Secondary | ICD-10-CM

## 2022-10-31 DIAGNOSIS — O24415 Gestational diabetes mellitus in pregnancy, controlled by oral hypoglycemic drugs: Secondary | ICD-10-CM

## 2022-10-31 DIAGNOSIS — Z3A31 31 weeks gestation of pregnancy: Secondary | ICD-10-CM

## 2022-10-31 DIAGNOSIS — O09523 Supervision of elderly multigravida, third trimester: Secondary | ICD-10-CM

## 2022-10-31 DIAGNOSIS — O24419 Gestational diabetes mellitus in pregnancy, unspecified control: Secondary | ICD-10-CM | POA: Insufficient documentation

## 2022-10-31 DIAGNOSIS — Z603 Acculturation difficulty: Secondary | ICD-10-CM

## 2022-10-31 DIAGNOSIS — Z87898 Personal history of other specified conditions: Secondary | ICD-10-CM

## 2022-10-31 DIAGNOSIS — O24410 Gestational diabetes mellitus in pregnancy, diet controlled: Secondary | ICD-10-CM

## 2022-10-31 DIAGNOSIS — O09293 Supervision of pregnancy with other poor reproductive or obstetric history, third trimester: Secondary | ICD-10-CM

## 2022-10-31 DIAGNOSIS — O35BXX0 Maternal care for other (suspected) fetal abnormality and damage, fetal cardiac anomalies, not applicable or unspecified: Secondary | ICD-10-CM

## 2022-10-31 MED ORDER — METFORMIN HCL 500 MG PO TABS
500.0000 mg | ORAL_TABLET | Freq: Two times a day (BID) | ORAL | 3 refills | Status: DC
Start: 2022-10-31 — End: 2022-12-09

## 2022-10-31 NOTE — Patient Instructions (Signed)
Tubal Ligation with C/S or Postpartum   With C/S: $1170  Postpartum: $1280 + anesthesia   Patient needs to inform staff of desire for tubal ligation as early as possible in the pregnancy  Pre-payment is required for procedure to be scheduled  Payment plans are available upon request   Patient will need to call [facility] (Anesthesia) at [phone] for estimate   Ligadura de trompas con cesrea o despus del parto Con cesrea: $1170 Despus del parto: $1280 ms la anestesia La paciente debe informarle al personal de su deseo de hacerse la ligadura de trompas tan pronto  como le sea posible durante el [place]. Se requiere el pago por adelantado para que el procedimiento pueda programarse. Los planes de pago estn disponibles por solicitud. La paciente debe llamar a [facility] Janann August) al [phone] para pedir la cotizacin.   Outpatient tubal ligation   Estimated out-of-pocket total for patient (does NOT include anesthesia)  Procedure fee + Facility fee  Salpingectomy: $855 + $5835.90 = $6690.90 [place]: $497.80 + $5835.90 = $6333.70 [place]: $497.80 + $5835.90 = $5009.38  Patient needs to inform staff of desire for tubal ligation Pre-payment of procedure fee is required for procedure to be scheduled  Payment plans are available upon request  Need to apply for Financial Aid to reduce your cost, ask [place] how!   Patient will need to call [facility] (Anesthesia) at [phone] for estimate   Ligadura de trompas para pacientes ambulatorios Costo total aproximado que le toca a la paciente (NO incluye la anestesia) Costo del procedimiento ms costo del centro Salpingectoma: $855 + $5835.90 = $6789.38 [place]: $497.80 + $5835.90 = $6333.70 [place] (clips) [place]: $497.80 + $5835.90 = $6333.70 La paciente debe informarle al personal de su deseo de hacerse la ligadura de trompas. Se requiere el pago por adelantado del costo del procedimiento para que este se [employer]. Los  planes de pago estn disponibles por solicitud. La paciente debe llamar a [facility] Janann August) al [phone] para pedir la cotizacin.

## 2022-10-31 NOTE — Procedures (Signed)
Chevon Marier Jul 23, 1986 [redacted]w[redacted]d  Fetus A Non-Stress Test Interpretation for 10/31/22  Indication: IUGR  Fetal Heart Rate A Mode: External Baseline Rate (A): 145 bpm Variability: Moderate Accelerations: 15 x 15 Decelerations: None Multiple birth?: No  Uterine Activity Mode: Palpation, Toco Contraction Frequency (min): Occas Contraction Quality: Mild Resting Tone Palpated: Relaxed Resting Time: Adequate  Interpretation (Fetal Testing) Nonstress Test Interpretation: Reactive Comments: Dr. Judeth Cornfield reviewed tracing.

## 2022-10-31 NOTE — Progress Notes (Signed)
PRENATAL VISIT NOTE  Subjective:  Theresa Lambert is a 37 y.o. G5P4004 at [redacted]w[redacted]d being seen today for ongoing prenatal care.  She is currently monitored for the following issues for this high-risk pregnancy and has IUGR (intrauterine growth restriction) affecting care of mother; Supervision of high risk pregnancy, antepartum; AMA (advanced maternal age) multigravida 35+; Language barrier; History of low birth weight; and Diet controlled White classification A1 gestational diabetes mellitus (GDM) on their problem list.  Patient reports occasional contractions.  Contractions: Irritability. Vag. Bleeding: None.  Movement: Present. Denies leaking of fluid.   The following portions of the patient's history were reviewed and updated as appropriate: allergies, current medications, past family history, past medical history, past social history, past surgical history and problem list.   Objective:   Vitals:   10/31/22 1342  BP: 109/70  Pulse: 88  Weight: 154 lb 11.2 oz (70.2 kg)    Fetal Status: Fetal Heart Rate (bpm): 140 Fundal Height: 29 cm Movement: Present     General:  Alert, oriented and cooperative. Patient is in no acute distress.  Skin: Skin is warm and dry. No rash noted.   Cardiovascular: Normal heart rate noted  Respiratory: Normal respiratory effort, no problems with respiration noted  Abdomen: Soft, gravid, appropriate for gestational age.  Pain/Pressure: Absent     Pelvic: Cervical exam deferred        Extremities: Normal range of motion.  Edema: None  Mental Status: Normal mood and affect. Normal behavior. Normal judgment and thought content.   Assessment and Plan:  Pregnancy: G5P4004 at [redacted]w[redacted]d 1. Supervision of high risk pregnancy, antepartum - Seen in MAU yesterday for contractions, irregular on toco and cervix closed x 2 checks. Patient states improved since then.  - Has appt for TDAP with [facility]  2. Language barrier - Interpreter present   3. History of low birth  weight - FGR with this pregnancy as well, EFW on today's [place] 2.6%  - Per MFM, plan to confirm delivery timing based on next growth [place]  4. Diet controlled White classification A1 gestational diabetes mellitus (GDM) - 22/37 values are elevated over the last 1.5 weeks  - Discussed with Dr. Alysia Penna, agrees with plan to start Metformin 500 mg BID - Rx sent  - BPP today 10/10 with MFM   5. Multigravida of advanced maternal age in third trimester - Desires BTL, information sent to surgery scheduler and estimates given   6. Poor fetal growth affecting management of mother in third trimester, single or unspecified fetus - weekly BPPs and serial growth   7. [redacted] weeks gestation of pregnancy  8. Gestational diabetes mellitus (GDM) in third trimester controlled on oral hypoglycemic drug - metFORMIN (GLUCOPHAGE) 500 MG tablet; Take 1 tablet (500 mg total) by mouth 2 (two) times daily with a meal.  Dispense: 60 tablet; Refill: 3  Preterm labor symptoms and general obstetric precautions including but not limited to vaginal bleeding, contractions, leaking of fluid and fetal movement were reviewed in detail with the patient. Please refer to After Visit Summary for other counseling recommendations.   Return in about 2 weeks (around 11/14/2022) for [facility] MD only.  Future Appointments  Date Time Provider Department Center  11/02/2022 11:00 AM Carolan Shiver, RD [facility] NDM  11/07/2022  2:00 PM [facility] NURSE [facility]  11/07/2022  2:15 PM [facility] NST [facility]  11/07/2022  2:30 PM [facility] US3 [facility]  11/14/2022  9:35 AM Reva Bores, MD [facility]  11/14/2022  10:30 AM [facility] NURSE [facility]  11/14/2022 10:45 AM [facility] NST [facility]  11/14/2022 11:00 AM [facility] US1 [facility]  11/23/2022  7:15 AM [facility] NURSE [facility]  11/23/2022  7:30 AM [facility] US2 [facility]  11/23/2022  8:45 AM W[facility] NST [facility]  11/28/2022  2:15 PM [facility] NURSE [facility]  11/28/2022  2:30 PM [facility] US3  [facility]  11/28/2022  3:15 PM [facility] NST [facility]    Vonzella Nipple, PA-C

## 2022-11-01 ENCOUNTER — Ambulatory Visit: Payer: Self-pay

## 2022-11-02 ENCOUNTER — Ambulatory Visit: Payer: Self-pay | Admitting: Registered"

## 2022-11-07 ENCOUNTER — Ambulatory Visit: Payer: Self-pay | Attending: Obstetrics | Admitting: *Deleted

## 2022-11-07 ENCOUNTER — Other Ambulatory Visit: Payer: Self-pay | Admitting: Obstetrics

## 2022-11-07 ENCOUNTER — Ambulatory Visit (HOSPITAL_BASED_OUTPATIENT_CLINIC_OR_DEPARTMENT_OTHER): Payer: Self-pay

## 2022-11-07 ENCOUNTER — Encounter: Payer: Self-pay | Admitting: *Deleted

## 2022-11-07 ENCOUNTER — Ambulatory Visit: Payer: Self-pay

## 2022-11-07 ENCOUNTER — Ambulatory Visit: Payer: Self-pay | Admitting: *Deleted

## 2022-11-07 VITALS — BP 95/60 | HR 78

## 2022-11-07 DIAGNOSIS — O09513 Supervision of elderly primigravida, third trimester: Secondary | ICD-10-CM | POA: Insufficient documentation

## 2022-11-07 DIAGNOSIS — Z87898 Personal history of other specified conditions: Secondary | ICD-10-CM

## 2022-11-07 DIAGNOSIS — Z3A32 32 weeks gestation of pregnancy: Secondary | ICD-10-CM | POA: Insufficient documentation

## 2022-11-07 DIAGNOSIS — O35BXX0 Maternal care for other (suspected) fetal abnormality and damage, fetal cardiac anomalies, not applicable or unspecified: Secondary | ICD-10-CM

## 2022-11-07 DIAGNOSIS — O365930 Maternal care for other known or suspected poor fetal growth, third trimester, not applicable or unspecified: Secondary | ICD-10-CM | POA: Insufficient documentation

## 2022-11-07 DIAGNOSIS — O24419 Gestational diabetes mellitus in pregnancy, unspecified control: Secondary | ICD-10-CM

## 2022-11-07 DIAGNOSIS — Z603 Acculturation difficulty: Secondary | ICD-10-CM

## 2022-11-07 DIAGNOSIS — O09523 Supervision of elderly multigravida, third trimester: Secondary | ICD-10-CM

## 2022-11-07 DIAGNOSIS — O0990 Supervision of high risk pregnancy, unspecified, unspecified trimester: Secondary | ICD-10-CM

## 2022-11-07 DIAGNOSIS — O24410 Gestational diabetes mellitus in pregnancy, diet controlled: Secondary | ICD-10-CM

## 2022-11-07 DIAGNOSIS — O09293 Supervision of pregnancy with other poor reproductive or obstetric history, third trimester: Secondary | ICD-10-CM | POA: Insufficient documentation

## 2022-11-07 NOTE — Procedures (Signed)
Theresa Lambert 14-Aug-1985 [redacted]w[redacted]d  Fetus A Non-Stress Test Interpretation for 11/07/22  Indication: IUGR  Fetal Heart Rate A Mode: External Baseline Rate (A): 140 bpm Variability: Moderate Accelerations: 15 x 15 Decelerations: None Multiple birth?: No  Uterine Activity Mode: Palpation, Toco Contraction Frequency (min): none Resting Tone Palpated: Relaxed  Interpretation (Fetal Testing) Nonstress Test Interpretation: Reactive Overall Impression: Reassuring for gestational age Comments: Dr. Parke Poisson reviewed tracing

## 2022-11-14 ENCOUNTER — Ambulatory Visit: Payer: Self-pay | Admitting: *Deleted

## 2022-11-14 ENCOUNTER — Other Ambulatory Visit: Payer: Self-pay | Admitting: Obstetrics

## 2022-11-14 ENCOUNTER — Ambulatory Visit: Payer: Self-pay | Attending: Obstetrics

## 2022-11-14 ENCOUNTER — Ambulatory Visit (INDEPENDENT_AMBULATORY_CARE_PROVIDER_SITE_OTHER): Payer: Self-pay | Admitting: Family Medicine

## 2022-11-14 VITALS — BP 105/57 | HR 66

## 2022-11-14 VITALS — BP 104/68 | HR 74 | Wt 152.0 lb

## 2022-11-14 DIAGNOSIS — O09523 Supervision of elderly multigravida, third trimester: Secondary | ICD-10-CM

## 2022-11-14 DIAGNOSIS — O24419 Gestational diabetes mellitus in pregnancy, unspecified control: Secondary | ICD-10-CM

## 2022-11-14 DIAGNOSIS — Z758 Other problems related to medical facilities and other health care: Secondary | ICD-10-CM

## 2022-11-14 DIAGNOSIS — O24410 Gestational diabetes mellitus in pregnancy, diet controlled: Secondary | ICD-10-CM

## 2022-11-14 DIAGNOSIS — Z87898 Personal history of other specified conditions: Secondary | ICD-10-CM

## 2022-11-14 DIAGNOSIS — O365930 Maternal care for other known or suspected poor fetal growth, third trimester, not applicable or unspecified: Secondary | ICD-10-CM

## 2022-11-14 DIAGNOSIS — Z603 Acculturation difficulty: Secondary | ICD-10-CM

## 2022-11-14 DIAGNOSIS — O35BXX0 Maternal care for other (suspected) fetal abnormality and damage, fetal cardiac anomalies, not applicable or unspecified: Secondary | ICD-10-CM

## 2022-11-14 DIAGNOSIS — Z3A33 33 weeks gestation of pregnancy: Secondary | ICD-10-CM

## 2022-11-14 DIAGNOSIS — O0990 Supervision of high risk pregnancy, unspecified, unspecified trimester: Secondary | ICD-10-CM

## 2022-11-14 DIAGNOSIS — O09513 Supervision of elderly primigravida, third trimester: Secondary | ICD-10-CM

## 2022-11-14 DIAGNOSIS — O24415 Gestational diabetes mellitus in pregnancy, controlled by oral hypoglycemic drugs: Secondary | ICD-10-CM

## 2022-11-14 DIAGNOSIS — O0993 Supervision of high risk pregnancy, unspecified, third trimester: Secondary | ICD-10-CM

## 2022-11-14 NOTE — Procedures (Signed)
Theresa Lambert 06-12-1986 [redacted]w[redacted]d  Fetus A Non-Stress Test Interpretation for 11/14/22  Indication: IUGR  Fetal Heart Rate A Mode: External Baseline Rate (A): 145 bpm Variability: Moderate Accelerations: 15 x 15 Decelerations: None Multiple birth?: No  Uterine Activity Mode: Palpation, Toco Contraction Frequency (min): 1 UC Contraction Quality: Mild Resting Tone Palpated: Relaxed Resting Time: Adequate  Interpretation (Fetal Testing) Nonstress Test Interpretation: Reactive Comments: Dr. Darra Lis reviewed tracing.

## 2022-11-14 NOTE — Progress Notes (Signed)
   PRENATAL VISIT NOTE  Subjective:  Theresa Lambert is a 37 y.o. G5P4004 at [redacted]w[redacted]d being seen today for ongoing prenatal care.  She is currently monitored for the following issues for this low-risk pregnancy and has IUGR (intrauterine growth restriction) affecting care of mother; Supervision of high risk pregnancy, antepartum; AMA (advanced maternal age) multigravida 35+; Language barrier; History of low birth weight; and Diet controlled White classification A1 gestational diabetes mellitus (GDM) on their problem list.  Patient reports no complaints.  Contractions: Irritability. Vag. Bleeding: None.  Movement: Present. Denies leaking of fluid.   The following portions of the patient's history were reviewed and updated as appropriate: allergies, current medications, past family history, past medical history, past social history, past surgical history and problem list.   Objective:   Vitals:   11/14/22 1023  BP: 104/68  Pulse: 74  Weight: 152 lb (68.9 kg)    Fetal Status: Fetal Heart Rate (bpm): 142 Fundal Height: 27 cm Movement: Present     General:  Alert, oriented and cooperative. Patient is in no acute distress.  Skin: Skin is warm and dry. No rash noted.   Cardiovascular: Normal heart rate noted  Respiratory: Normal respiratory effort, no problems with respiration noted  Abdomen: Soft, gravid, appropriate for gestational age.  Pain/Pressure: Absent     Pelvic: Cervical exam deferred        Extremities: Normal range of motion.  Edema: None  Mental Status: Normal mood and affect. Normal behavior. Normal judgment and thought content.   Assessment and Plan:  Pregnancy: G5P4004 at [redacted]w[redacted]d 1. Supervision of high risk pregnancy, antepartum Continue prenatal care.  2. Language barrier Spanish interpreter: Claudia used  3. Poor fetal growth affecting management of mother in third trimester, single or unspecified fetus In antenatal testing with MFM Delivery at 37 weeks seems  likely  4. Multigravida of advanced maternal age in third trimester LR NIPT  5. Gestational diabetes mellitus (GDM) in third trimester controlled on oral hypoglycemic drug  See log - on metformin, dietary recall. Add exercise    Preterm labor symptoms and general obstetric precautions including but not limited to vaginal bleeding, contractions, leaking of fluid and fetal movement were reviewed in detail with the patient. Please refer to After Visit Summary for other counseling recommendations.   Return in 2 weeks (on 11/28/2022).  Future Appointments  Date Time Provider Department Center  11/14/2022 11:00 AM [facility] US1 [facility]  11/23/2022  7:15 AM [facility] NURSE [facility]  11/23/2022  7:30 AM [facility] US2 [facility]  11/23/2022  8:45 AM [facility] NST [facility]  11/28/2022  9:15 AM Carolan Shiver, RD [facility] NDM  11/28/2022  2:15 PM [facility] NURSE [facility]  11/28/2022  2:30 PM [facility] US3 [facility]  11/28/2022  3:15 PM [facility] NST [facility]    Reva Bores, MD

## 2022-11-18 ENCOUNTER — Encounter: Payer: Self-pay | Admitting: Student

## 2022-11-23 ENCOUNTER — Ambulatory Visit: Payer: Medicaid Other | Attending: Obstetrics and Gynecology

## 2022-11-23 ENCOUNTER — Ambulatory Visit: Payer: Self-pay | Admitting: *Deleted

## 2022-11-23 VITALS — BP 110/70 | HR 74

## 2022-11-23 DIAGNOSIS — O24415 Gestational diabetes mellitus in pregnancy, controlled by oral hypoglycemic drugs: Secondary | ICD-10-CM | POA: Insufficient documentation

## 2022-11-23 DIAGNOSIS — O09523 Supervision of elderly multigravida, third trimester: Secondary | ICD-10-CM | POA: Diagnosis not present

## 2022-11-23 DIAGNOSIS — O365930 Maternal care for other known or suspected poor fetal growth, third trimester, not applicable or unspecified: Secondary | ICD-10-CM | POA: Diagnosis not present

## 2022-11-23 DIAGNOSIS — O0990 Supervision of high risk pregnancy, unspecified, unspecified trimester: Secondary | ICD-10-CM

## 2022-11-23 DIAGNOSIS — Z87898 Personal history of other specified conditions: Secondary | ICD-10-CM

## 2022-11-23 DIAGNOSIS — Z758 Other problems related to medical facilities and other health care: Secondary | ICD-10-CM | POA: Insufficient documentation

## 2022-11-23 DIAGNOSIS — O35BXX0 Maternal care for other (suspected) fetal abnormality and damage, fetal cardiac anomalies, not applicable or unspecified: Secondary | ICD-10-CM

## 2022-11-23 DIAGNOSIS — Z603 Acculturation difficulty: Secondary | ICD-10-CM | POA: Diagnosis present

## 2022-11-23 DIAGNOSIS — O24410 Gestational diabetes mellitus in pregnancy, diet controlled: Secondary | ICD-10-CM | POA: Diagnosis not present

## 2022-11-23 DIAGNOSIS — Z3A35 35 weeks gestation of pregnancy: Secondary | ICD-10-CM

## 2022-11-23 NOTE — Procedures (Signed)
Theresa Lambert Mar 13, 1986 [redacted]w[redacted]d  Fetus A Non-Stress Test Interpretation for 11/23/22  Indication: IUGR, Gestational Diabetes medication controlled, and Advanced Maternal Age >40 years  Fetal Heart Rate A Mode: External Baseline Rate (A): 135 bpm Variability: Moderate Accelerations: 15 x 15 Decelerations: None Multiple birth?: No  Uterine Activity Mode: Toco Contraction Frequency (min): none Resting Tone Palpated: Relaxed  Interpretation (Fetal Testing) Nonstress Test Interpretation: Reactive Overall Impression: Reassuring for gestational age Comments: tracing reviewed by Dr. Darra Lis

## 2022-11-28 ENCOUNTER — Other Ambulatory Visit (HOSPITAL_COMMUNITY)
Admission: RE | Admit: 2022-11-28 | Discharge: 2022-11-28 | Disposition: A | Discharge status: Home / Self Care | Payer: Self-pay | Source: Ambulatory Visit | Attending: Obstetrics and Gynecology | Admitting: Obstetrics and Gynecology

## 2022-11-28 ENCOUNTER — Ambulatory Visit: Payer: Self-pay | Admitting: *Deleted

## 2022-11-28 ENCOUNTER — Other Ambulatory Visit: Payer: Self-pay

## 2022-11-28 ENCOUNTER — Ambulatory Visit: Payer: Medicaid Other | Attending: Obstetrics and Gynecology

## 2022-11-28 ENCOUNTER — Ambulatory Visit: Payer: Self-pay | Admitting: Registered"

## 2022-11-28 ENCOUNTER — Encounter: Payer: Self-pay | Admitting: *Deleted

## 2022-11-28 ENCOUNTER — Encounter: Payer: Self-pay | Admitting: Family Medicine

## 2022-11-28 ENCOUNTER — Ambulatory Visit (INDEPENDENT_AMBULATORY_CARE_PROVIDER_SITE_OTHER): Payer: Self-pay | Admitting: Obstetrics and Gynecology

## 2022-11-28 VITALS — BP 122/64 | HR 88 | Wt 153.2 lb

## 2022-11-28 VITALS — BP 106/67 | HR 80

## 2022-11-28 DIAGNOSIS — Z758 Other problems related to medical facilities and other health care: Secondary | ICD-10-CM

## 2022-11-28 DIAGNOSIS — Z87898 Personal history of other specified conditions: Secondary | ICD-10-CM | POA: Diagnosis present

## 2022-11-28 DIAGNOSIS — O0990 Supervision of high risk pregnancy, unspecified, unspecified trimester: Secondary | ICD-10-CM | POA: Diagnosis present

## 2022-11-28 DIAGNOSIS — O09523 Supervision of elderly multigravida, third trimester: Secondary | ICD-10-CM | POA: Diagnosis not present

## 2022-11-28 DIAGNOSIS — O09293 Supervision of pregnancy with other poor reproductive or obstetric history, third trimester: Secondary | ICD-10-CM

## 2022-11-28 DIAGNOSIS — O35BXX0 Maternal care for other (suspected) fetal abnormality and damage, fetal cardiac anomalies, not applicable or unspecified: Secondary | ICD-10-CM

## 2022-11-28 DIAGNOSIS — O365990 Maternal care for other known or suspected poor fetal growth, unspecified trimester, not applicable or unspecified: Secondary | ICD-10-CM | POA: Diagnosis present

## 2022-11-28 DIAGNOSIS — O365930 Maternal care for other known or suspected poor fetal growth, third trimester, not applicable or unspecified: Secondary | ICD-10-CM

## 2022-11-28 DIAGNOSIS — Z603 Acculturation difficulty: Secondary | ICD-10-CM | POA: Diagnosis present

## 2022-11-28 DIAGNOSIS — Z3493 Encounter for supervision of normal pregnancy, unspecified, third trimester: Secondary | ICD-10-CM | POA: Insufficient documentation

## 2022-11-28 DIAGNOSIS — O24415 Gestational diabetes mellitus in pregnancy, controlled by oral hypoglycemic drugs: Secondary | ICD-10-CM | POA: Insufficient documentation

## 2022-11-28 DIAGNOSIS — Z3A35 35 weeks gestation of pregnancy: Secondary | ICD-10-CM

## 2022-11-28 DIAGNOSIS — Z3490 Encounter for supervision of normal pregnancy, unspecified, unspecified trimester: Secondary | ICD-10-CM

## 2022-11-28 DIAGNOSIS — O24410 Gestational diabetes mellitus in pregnancy, diet controlled: Secondary | ICD-10-CM | POA: Diagnosis not present

## 2022-11-28 NOTE — Progress Notes (Signed)
   PRENATAL VISIT NOTE  Subjective:  Theresa Lambert is a 37 y.o. G5P4004 at [redacted]w[redacted]d being seen today for ongoing prenatal care.  She is currently monitored for the following issues for this high-risk pregnancy and has IUGR (intrauterine growth restriction) affecting care of mother; Supervision of high risk pregnancy, antepartum; AMA (advanced maternal age) multigravida 35+; Language barrier; History of low birth weight; and GDM (gestational diabetes mellitus) on their problem list.  Patient doing well with no acute concerns today. She reports no complaints.  Contractions: Not present. Vag. Bleeding: None.  Movement: Present. Denies leaking of fluid.   The following portions of the patient's history were reviewed and updated as appropriate: allergies, current medications, past family history, past medical history, past social history, past surgical history and problem list. Problem list updated.  Objective:   Vitals:   11/28/22 1352  BP: 122/64  Pulse: 88  Weight: 153 lb 3.2 oz (69.5 kg)    Fetal Status: Fetal Heart Rate (bpm): 152 Fundal Height: 34 cm Movement: Present     General:  Alert, oriented and cooperative. Patient is in no acute distress.  Skin: Skin is warm and dry. No rash noted.   Cardiovascular: Normal heart rate noted  Respiratory: Normal respiratory effort, no problems with respiration noted  Abdomen: Soft, gravid, appropriate for gestational age.  Pain/Pressure: Present     Pelvic: Cervical exam performed Dilation: Fingertip Effacement (%): 60 Station: Ballotable  Extremities: Normal range of motion.  Edema: None  Mental Status:  Normal mood and affect. Normal behavior. Normal judgment and thought content.   Assessment and Plan:  Pregnancy: G5P4004 at [redacted]w[redacted]d  1. [redacted] weeks gestation of pregnancy   2. Gestational diabetes mellitus (GDM) in third trimester controlled on oral hypoglycemic drug FBS: 83-100 PPBS: 87-177, most in range  3. Supervision of high risk  pregnancy, antepartum Continue routine prenatal care Schedule IOL at 37 weeks   4. Language barrier Live interpreter present  5. Poor fetal growth affecting management of mother in third trimester, single or unspecified fetus EFW at 4.1 % MFM recommended delivery at 37 weeks  6. Multigravida of advanced maternal age in third trimester   Preterm labor symptoms and general obstetric precautions including but not limited to vaginal bleeding, contractions, leaking of fluid and fetal movement were reviewed in detail with the patient.  Please refer to After Visit Summary for other counseling recommendations.   No follow-ups on file.   Mariel Aloe, MD Faculty Attending Center for [facility]

## 2022-11-28 NOTE — Procedures (Signed)
Theresa Lambert [birth year] [redacted]w[redacted]d  Fetus A Non-Stress Test Interpretation for 11/28/22  Indication: Gestational Diabetes medication controlled, SGA  Fetal Heart Rate A Mode: External Baseline Rate (A): 140 bpm Variability: Moderate Accelerations: 15 x 15 Decelerations: None Multiple birth?: No  Uterine Activity Mode: Palpation, Toco Contraction Frequency (min): none Resting Tone Palpated: Relaxed  Interpretation (Fetal Testing) Nonstress Test Interpretation: Reactive Overall Impression: Reassuring for gestational age Comments: Dr. Darra Lis reviewed tracing

## 2022-11-29 ENCOUNTER — Other Ambulatory Visit: Payer: Self-pay | Admitting: *Deleted

## 2022-11-29 DIAGNOSIS — O24419 Gestational diabetes mellitus in pregnancy, unspecified control: Secondary | ICD-10-CM

## 2022-11-29 DIAGNOSIS — O365990 Maternal care for other known or suspected poor fetal growth, unspecified trimester, not applicable or unspecified: Secondary | ICD-10-CM

## 2022-11-29 LAB — GC/CHLAMYDIA PROBE AMP (~~LOC~~) NOT AT ARMC
Chlamydia: NEGATIVE
Comment: NEGATIVE
Comment: NORMAL
Neisseria Gonorrhea: NEGATIVE

## 2022-11-30 ENCOUNTER — Other Ambulatory Visit: Payer: Self-pay | Admitting: Advanced Practice Midwife

## 2022-12-02 LAB — CULTURE, BETA STREP (GROUP B ONLY): Strep Gp B Culture: NEGATIVE

## 2022-12-06 ENCOUNTER — Encounter (HOSPITAL_COMMUNITY): Payer: Self-pay | Admitting: *Deleted

## 2022-12-06 ENCOUNTER — Telehealth (HOSPITAL_COMMUNITY): Payer: Self-pay | Admitting: *Deleted

## 2022-12-06 ENCOUNTER — Ambulatory Visit: Payer: Medicaid Other | Admitting: *Deleted

## 2022-12-06 ENCOUNTER — Ambulatory Visit (HOSPITAL_BASED_OUTPATIENT_CLINIC_OR_DEPARTMENT_OTHER): Payer: Medicaid Other

## 2022-12-06 VITALS — BP 105/67 | HR 68

## 2022-12-06 DIAGNOSIS — O0990 Supervision of high risk pregnancy, unspecified, unspecified trimester: Secondary | ICD-10-CM | POA: Insufficient documentation

## 2022-12-06 DIAGNOSIS — O365990 Maternal care for other known or suspected poor fetal growth, unspecified trimester, not applicable or unspecified: Secondary | ICD-10-CM

## 2022-12-06 DIAGNOSIS — Z758 Other problems related to medical facilities and other health care: Secondary | ICD-10-CM | POA: Insufficient documentation

## 2022-12-06 DIAGNOSIS — O24415 Gestational diabetes mellitus in pregnancy, controlled by oral hypoglycemic drugs: Secondary | ICD-10-CM | POA: Insufficient documentation

## 2022-12-06 DIAGNOSIS — O24419 Gestational diabetes mellitus in pregnancy, unspecified control: Secondary | ICD-10-CM

## 2022-12-06 DIAGNOSIS — O09523 Supervision of elderly multigravida, third trimester: Secondary | ICD-10-CM

## 2022-12-06 DIAGNOSIS — Z603 Acculturation difficulty: Secondary | ICD-10-CM | POA: Insufficient documentation

## 2022-12-06 DIAGNOSIS — Z87898 Personal history of other specified conditions: Secondary | ICD-10-CM | POA: Insufficient documentation

## 2022-12-06 NOTE — Telephone Encounter (Signed)
[id number] interpreter number Preadmission screen

## 2022-12-07 ENCOUNTER — Encounter (HOSPITAL_COMMUNITY): Payer: Self-pay | Admitting: Obstetrics and Gynecology

## 2022-12-07 ENCOUNTER — Inpatient Hospital Stay (HOSPITAL_COMMUNITY)
Admission: RE | Admit: 2022-12-07 | Discharge: 2022-12-09 | DRG: 807 | Disposition: A | Discharge status: Home / Self Care | Payer: Medicaid Other | Attending: Obstetrics and Gynecology | Admitting: Obstetrics and Gynecology

## 2022-12-07 ENCOUNTER — Inpatient Hospital Stay (HOSPITAL_COMMUNITY): Payer: Medicaid Other | Admitting: Anesthesiology

## 2022-12-07 ENCOUNTER — Inpatient Hospital Stay (HOSPITAL_COMMUNITY): Payer: Medicaid Other

## 2022-12-07 ENCOUNTER — Other Ambulatory Visit: Payer: Self-pay

## 2022-12-07 DIAGNOSIS — Z7982 Long term (current) use of aspirin: Secondary | ICD-10-CM | POA: Diagnosis not present

## 2022-12-07 DIAGNOSIS — Z87898 Personal history of other specified conditions: Secondary | ICD-10-CM | POA: Diagnosis not present

## 2022-12-07 DIAGNOSIS — O09529 Supervision of elderly multigravida, unspecified trimester: Secondary | ICD-10-CM

## 2022-12-07 DIAGNOSIS — Z3A37 37 weeks gestation of pregnancy: Secondary | ICD-10-CM

## 2022-12-07 DIAGNOSIS — O24424 Gestational diabetes mellitus in childbirth, insulin controlled: Secondary | ICD-10-CM | POA: Diagnosis not present

## 2022-12-07 DIAGNOSIS — Z758 Other problems related to medical facilities and other health care: Secondary | ICD-10-CM

## 2022-12-07 DIAGNOSIS — O26893 Other specified pregnancy related conditions, third trimester: Secondary | ICD-10-CM | POA: Diagnosis present

## 2022-12-07 DIAGNOSIS — O365930 Maternal care for other known or suspected poor fetal growth, third trimester, not applicable or unspecified: Secondary | ICD-10-CM | POA: Diagnosis present

## 2022-12-07 DIAGNOSIS — Z3490 Encounter for supervision of normal pregnancy, unspecified, unspecified trimester: Secondary | ICD-10-CM

## 2022-12-07 DIAGNOSIS — O24425 Gestational diabetes mellitus in childbirth, controlled by oral hypoglycemic drugs: Secondary | ICD-10-CM | POA: Diagnosis present

## 2022-12-07 DIAGNOSIS — O09523 Supervision of elderly multigravida, third trimester: Secondary | ICD-10-CM | POA: Diagnosis not present

## 2022-12-07 DIAGNOSIS — O24415 Gestational diabetes mellitus in pregnancy, controlled by oral hypoglycemic drugs: Secondary | ICD-10-CM | POA: Diagnosis present

## 2022-12-07 DIAGNOSIS — Z603 Acculturation difficulty: Secondary | ICD-10-CM | POA: Diagnosis present

## 2022-12-07 DIAGNOSIS — O24419 Gestational diabetes mellitus in pregnancy, unspecified control: Secondary | ICD-10-CM | POA: Diagnosis present

## 2022-12-07 DIAGNOSIS — O0990 Supervision of high risk pregnancy, unspecified, unspecified trimester: Principal | ICD-10-CM

## 2022-12-07 DIAGNOSIS — O365931 Maternal care for other known or suspected poor fetal growth, third trimester, fetus 1: Secondary | ICD-10-CM | POA: Diagnosis present

## 2022-12-07 HISTORY — DX: Gestational diabetes mellitus in pregnancy, unspecified control: O24.419

## 2022-12-07 LAB — COMPREHENSIVE METABOLIC PANEL
ALT: 27 U/L (ref 0–44)
AST: 22 U/L (ref 15–41)
Albumin: 2.9 g/dL — ABNORMAL LOW (ref 3.5–5.0)
Alkaline Phosphatase: 131 U/L — ABNORMAL HIGH (ref 38–126)
Anion gap: 10 (ref 5–15)
BUN: 5 mg/dL — ABNORMAL LOW (ref 6–20)
CO2: 22 mmol/L (ref 22–32)
Calcium: 8.3 mg/dL — ABNORMAL LOW (ref 8.9–10.3)
Chloride: 102 mmol/L (ref 98–111)
Creatinine, Ser: 0.55 mg/dL (ref 0.44–1.00)
GFR, Estimated: >60 mL/min (ref >60)
Glucose, Bld: 95 mg/dL (ref 70–99)
Potassium: 3.4 mmol/L — ABNORMAL LOW (ref 3.5–5.1)
Sodium: 134 mmol/L — ABNORMAL LOW (ref 135–145)
Total Bilirubin: 0.3 mg/dL (ref 0.3–1.2)
Total Protein: 6.6 g/dL (ref 6.5–8.1)

## 2022-12-07 LAB — TYPE AND SCREEN
ABO/RH(D): O POS
Antibody Screen: NEGATIVE

## 2022-12-07 LAB — CBC
HCT: 32.3 % — ABNORMAL LOW (ref 36.0–46.0)
Hemoglobin: 11.3 g/dL — ABNORMAL LOW (ref 12.0–15.0)
MCH: 31.2 pg (ref 26.0–34.0)
MCHC: 35 g/dL (ref 30.0–36.0)
MCV: 89.2 fL (ref 80.0–100.0)
Platelets: 375 10*3/uL (ref 150–400)
RBC: 3.62 MIL/uL — ABNORMAL LOW (ref 3.87–5.11)
RDW: 12.3 % (ref 11.5–15.5)
WBC: 7.3 10*3/uL (ref 4.0–10.5)
nRBC: 0 % (ref 0.0–0.2)

## 2022-12-07 LAB — GLUCOSE, CAPILLARY
Glucose-Capillary: 92 mg/dL (ref 70–99)
Glucose-Capillary: 73 mg/dL (ref 70–99)
Glucose-Capillary: 75 mg/dL (ref 70–99)
Glucose-Capillary: 78 mg/dL (ref 70–99)

## 2022-12-07 LAB — RPR: RPR Ser Ql: NONREACTIVE

## 2022-12-07 MED ORDER — EPHEDRINE 5 MG/ML INJ: 10.0000 mg | INTRAVENOUS | Status: DC | PRN

## 2022-12-07 MED ORDER — TERBUTALINE SULFATE 1 MG/ML IJ SOLN: 0.2500 mg | Freq: Once | INTRAMUSCULAR | Status: DC | PRN

## 2022-12-07 MED ORDER — OXYTOCIN BOLUS FROM INFUSION: 333.0000 mL | Freq: Once | INTRAVENOUS | Status: DC

## 2022-12-07 MED ORDER — FENTANYL CITRATE (PF) 100 MCG/2ML IJ SOLN: 50.0000 ug | INTRAMUSCULAR | Status: DC | PRN

## 2022-12-07 MED ORDER — LIDOCAINE HCL (PF) 1 % IJ SOLN: 30.0000 mL | INTRAMUSCULAR | Status: DC | PRN

## 2022-12-07 MED ORDER — OXYTOCIN-SODIUM CHLORIDE 30-0.9 UT/500ML-% IV SOLN: 1.0000 m[IU]/min | INTRAVENOUS | Status: DC

## 2022-12-07 MED ORDER — OXYCODONE-ACETAMINOPHEN 5-325 MG PO TABS: 1.0000 | ORAL_TABLET | ORAL | Status: DC | PRN

## 2022-12-07 MED ORDER — SOD CITRATE-CITRIC ACID 500-334 MG/5ML PO SOLN: 30.0000 mL | ORAL | Status: DC | PRN

## 2022-12-07 MED ORDER — LACTATED RINGERS IV SOLN
500.0000 mL | INTRAVENOUS | Status: DC | PRN
  Administered 2022-12-07 (×2): 500 mL via INTRAVENOUS

## 2022-12-07 MED ORDER — LIDOCAINE HCL (PF) 1 % IJ SOLN
INTRAMUSCULAR | Status: DC | PRN
  Administered 2022-12-07: 3 mL via EPIDURAL
  Administered 2022-12-07: 5 mL via EPIDURAL

## 2022-12-07 MED ORDER — PHENYLEPHRINE 80 MCG/ML (10ML) SYRINGE FOR IV PUSH (FOR BLOOD PRESSURE SUPPORT): 80.0000 ug | PREFILLED_SYRINGE | INTRAVENOUS | Status: DC | PRN

## 2022-12-07 MED ORDER — FENTANYL-BUPIVACAINE-NACL 0.5-0.125-0.9 MG/250ML-% EP SOLN
12.0000 mL/h | EPIDURAL | Status: DC | PRN
  Administered 2022-12-07: 12 mL/h via EPIDURAL
  Filled 2022-12-07: qty 250

## 2022-12-07 MED ORDER — DIPHENHYDRAMINE HCL 50 MG/ML IJ SOLN: 12.5000 mg | INTRAMUSCULAR | Status: DC | PRN

## 2022-12-07 MED ORDER — ACETAMINOPHEN 325 MG PO TABS: 650.0000 mg | ORAL_TABLET | ORAL | Status: DC | PRN

## 2022-12-07 MED ORDER — ONDANSETRON HCL 4 MG/2ML IJ SOLN: 4.0000 mg | Freq: Four times a day (QID) | INTRAMUSCULAR | Status: DC | PRN

## 2022-12-07 MED ORDER — MISOPROSTOL 25 MCG QUARTER TABLET
25.0000 ug | ORAL_TABLET | Freq: Once | ORAL | Status: AC
  Administered 2022-12-07: 25 ug via VAGINAL
  Filled 2022-12-07: qty 1

## 2022-12-07 MED ORDER — LACTATED RINGERS IV SOLN: INTRAVENOUS | Status: DC

## 2022-12-07 MED ORDER — OXYTOCIN-SODIUM CHLORIDE 30-0.9 UT/500ML-% IV SOLN: 2.5000 [IU]/h | INTRAVENOUS | Status: DC

## 2022-12-07 MED ORDER — OXYTOCIN-SODIUM CHLORIDE 30-0.9 UT/500ML-% IV SOLN
1.0000 m[IU]/min | INTRAVENOUS | Status: DC
  Filled 2022-12-07: qty 500

## 2022-12-07 MED ORDER — LACTATED RINGERS AMNIOINFUSION: INTRAVENOUS | Status: DC

## 2022-12-07 MED ORDER — LACTATED RINGERS IV SOLN: 500.0000 mL | Freq: Once | INTRAVENOUS | Status: DC

## 2022-12-07 NOTE — Discharge Summary (Addendum)
Postpartum Discharge Summary    Patient Name: Freddi Herczeg DOB: [birth year] MRN: [id number]  Date of admission: 12/07/2022 Delivery date:12/07/2022  Delivering provider: [name]  Date of discharge: 12/09/2022  Admitting diagnosis: IUGR (intrauterine growth restriction) affecting care of mother, third trimester, fetus 1 [O36.5931] Intrauterine pregnancy: [redacted]w[redacted]d     Secondary diagnosis:  Principal Problem:   Vaginal delivery Active Problems:   Supervision of high risk pregnancy, antepartum   AMA (advanced maternal age) multigravida 35+   Language barrier   GDM (gestational diabetes mellitus)   IUGR (intrauterine growth restriction) affecting care of mother, third trimester, fetus 1  Additional problems: n/a    Discharge diagnosis: Term Pregnancy Delivered and GDM A2                                              Post partum procedures: n/a Augmentation: AROM, Pitocin, and Cytotec Complications: None  Hospital course: Induction of Labor With Vaginal Delivery   37 y.o. yo [id number] at [redacted]w[redacted]d was admitted to the hospital 12/07/2022 for induction of labor.  Indication for induction:  FGR/ A2GDM .  Patient had an labor course complicated by none Membrane Rupture Time/Date: 9:23 PM ,12/07/2022   Delivery Method:Vaginal, Spontaneous  Episiotomy: None  Lacerations:  1st degree;Perineal  Details of delivery can be found in separate delivery note.  Patient had a postpartum course complicated by none. Patient is discharged home 12/09/22.  Newborn Data: Birth date:12/07/2022  Birth time:10:41 PM  Gender:Female  Living status:Living  Apgars:9 ,10  Weight:2030 g   Magnesium Sulfate received: No BMZ received: No Rhophylac:N/A MMR:N/A T-DaP:Given prenatally Flu: Yes Transfusion:No   Physical exam  Vitals:   12/08/22 0850 12/08/22 1421 12/08/22 2157 12/09/22 0531  BP: (!) 95/56 96/62 (!) 98/58 (!) 96/54  Pulse: 60 62 69 64  Resp: 20 19 18 18   Temp: 98.1 F (36.7 C) 98 F  (36.7 C) 97.7 F (36.5 C) 97.8 F (36.6 C)  TempSrc: Oral Oral Oral Oral  SpO2:  99% 99% 99%  Weight:      Height:       General: alert, cooperative, and no distress Lochia: appropriate Uterine Fundus: firm Incision: N/A DVT Evaluation: No evidence of DVT seen on physical exam. Labs: Lab Results  Component Value Date   WBC 11.9 (H) 12/08/2022   HGB 10.5 (L) 12/08/2022   HCT 29.2 (L) 12/08/2022   MCV 86.6 12/08/2022   PLT 337 12/08/2022      Latest Ref Rng & Units 12/07/2022    6:53 AM  CMP  Glucose 70 - 99 mg/dL 95   BUN 6 - 20 mg/dL 5   Creatinine 9.56 - 2.13 mg/dL 0.86   Sodium 578 - 469 mmol/L 134   Potassium 3.5 - 5.1 mmol/L 3.4   Chloride 98 - 111 mmol/L 102   CO2 22 - 32 mmol/L 22   Calcium 8.9 - 10.3 mg/dL 8.3   Total Protein 6.5 - 8.1 g/dL 6.6   Total Bilirubin 0.3 - 1.2 mg/dL 0.3   Alkaline Phos 38 - 126 U/L 131   AST 15 - 41 U/L 22   ALT 0 - 44 U/L 27    Edinburgh Score:    12/08/2022    1:25 AM  Edinburgh Postnatal Depression Scale Screening Tool  I have been able to laugh and see the funny side of things.  0  I have looked forward with enjoyment to things. 0  I have blamed myself unnecessarily when things went wrong. 0  I have been anxious or worried for no good reason. 0  I have felt scared or panicky for no good reason. 0  Things have been getting on top of me. 3  I have been so unhappy that I have had difficulty sleeping. 3  I have felt sad or miserable. 0  I have been so unhappy that I have been crying. 0  The thought of harming myself has occurred to me. 0  Edinburgh Postnatal Depression Scale Total 6     After visit meds:  Allergies as of 12/09/2022   No Known Allergies      Medication List     STOP taking these medications    Accu-Chek Guide w/Device Kit   Accu-Chek Softclix Lancets lancets   aspirin EC 81 MG tablet   cyclobenzaprine 10 MG tablet Commonly known as: FLEXERIL   glucose blood test strip   metFORMIN 500 MG  tablet Commonly known as: GLUCOPHAGE   prenatal vitamin w/FE, FA 27-1 MG Tabs tablet       TAKE these medications    acetaminophen 325 MG tablet Commonly known as: Tylenol Take 2 tablets (650 mg total) by mouth every 4 (four) hours.   benzocaine-Menthol 20-0.5 % Aero Commonly known as: DERMOPLAST Apply 1 Application topically as needed for irritation (perineal discomfort).   ibuprofen 600 MG tablet Commonly known as: ADVIL Take 1 tablet (600 mg total) by mouth every 6 (six) hours.   senna-docusate 8.6-50 MG tablet Commonly known as: Senokot-S Take 2 tablets by mouth daily. Start taking on: Dec 10, 2022         Discharge home in stable condition Infant Feeding: Bottle and Breast Infant Disposition:home with mother Discharge instruction: per After Visit Summary and Postpartum booklet. Activity: Advance as tolerated. Pelvic rest for 6 weeks.  Diet: routine diet Future Appointments: Future Appointments  Date Time Provider Department Center  01/16/2023  1:35 PM Sue Lush, FNP [facility]   Follow up Visit: Message sent to [facility] 5/15  Please schedule this patient for a In person postpartum visit in 6 weeks with the following provider: Any provider. Additional Postpartum F/U:2 hour GTT  High risk pregnancy complicated by: GDM Delivery mode:  Vaginal, Spontaneous  Anticipated Birth Control:  Nexplanon   12/09/2022 [name], DO

## 2022-12-07 NOTE — H&P (Signed)
OBSTETRIC ADMISSION HISTORY AND PHYSICAL  Theresa Lambert is a 37 y.o. female [phone] with IUP at [redacted]w[redacted]d by early [place] presenting for IOL. She reports +FMs, No LOF, no VB, no blurry vision, headaches or peripheral edema, and RUQ pain.  She plans on breast and bottle feeding. She request BTL for birth control. She received her prenatal care at  [facility]    Dating: By 6 wk [place] --->  Estimated Date of Delivery: 12/28/22  Sono:    @[redacted]w[redacted]d , CWD, normal anatomy, cephalic presentation, posterior placental lie, 2022g, 4.1% EFW. Nml dopplers 12/06/2022   Prenatal History/Complications:  -FGR -AMA -Language barrier -A2GDM (Metformin 500 MG twice daily)  Past Medical History: Past Medical History:  Diagnosis Date   Gastritis    Gestational diabetes    Headache     Past Surgical History: Past Surgical History:  Procedure Laterality Date   NO PAST SURGERIES      Obstetrical History: OB History     Gravida  5   Para  4   Term  4   Preterm  0   AB  0   Living  4      SAB  0   IAB  0   Ectopic  0   Multiple  0   Live Births  4           Social History Social History   Socioeconomic History   Marital status: Single    Spouse name: Not on file   Number of children: Not on file   Years of education: Not on file   Highest education level: Not on file  Occupational History    Employer: [employer]  Tobacco Use   Smoking status: Never   Smokeless tobacco: Never  Vaping Use   Vaping Use: Never used  Substance and Sexual Activity   Alcohol use: Not Currently   Drug use: Not Currently   Sexual activity: Yes    Birth control/protection: Implant    Comment: pt reports IC 10/30/22  Other Topics Concern   Not on file  Social History Narrative   Not on file   Social Determinants of Health   Financial Resource Strain: Not on file  Food Insecurity: No Food Insecurity (12/07/2022)   Hunger Vital Sign    Worried About Running Out of Food in the Last Year:  Never true    Ran Out of Food in the Last Year: Never true  Transportation Needs: No Transportation Needs (12/07/2022)   PRAPARE - Administrator, Civil Service (Medical): No    Lack of Transportation (Non-Medical): No  Physical Activity: Not on file  Stress: Not on file  Social Connections: Not on file    Family History: Family History  Problem Relation Age of Onset   Healthy Mother    Healthy Father    Asthma Son     Allergies: No Known Allergies  Medications Prior to Admission  Medication Sig Dispense Refill Last Dose   Accu-Chek Softclix Lancets lancets Use as instructed 100 each 12 Past Week   aspirin EC 81 MG tablet Take 81 mg by mouth daily. Swallow whole.   12/06/2022   Blood Glucose Monitoring Suppl (ACCU-CHEK GUIDE) w/Device KIT 1 Device by Does not apply route 4 (four) times daily. 1 kit 0 Past Week   cyclobenzaprine (FLEXERIL) 10 MG tablet Take 1 tablet (10 mg total) by mouth every 8 (eight) hours as needed for muscle spasms. 30 tablet 1 Past Week  glucose blood test strip Use as instructed 100 each 12 Past Week   metFORMIN (GLUCOPHAGE) 500 MG tablet Take 1 tablet (500 mg total) by mouth 2 (two) times daily with a meal. 60 tablet 3 Past Week   prenatal vitamin w/FE, FA (PRENATAL 1 + 1) 27-1 MG TABS tablet Take 1 tablet by mouth daily at 12 noon. 30 tablet 6 12/07/2022     Review of Systems   All systems reviewed and negative except as stated in HPI  Blood pressure 105/60, pulse 64, temperature 98.1 F (36.7 C), temperature source Oral, resp. rate 18, height 5\' 2"  (1.575 m), last menstrual period 04/03/2022, unknown if currently breastfeeding. General appearance: alert and no distress Lungs: normal effort Heart: regular rate noted Abdomen: gravid Extremities: No LE edema Presentation: cephalic Fetal monitoringBaseline: 140 bpm, Variability: Good {> 6 bpm), Accelerations: Reactive, and Decelerations: Absent Uterine activity irregular, and patient  unaware of most.      Prenatal labs: ABO, Rh: --/--/O POS (05/15 0630) Antibody: NEG (05/15 0630) Rubella: Immune (01/04 0000) RPR: Non Reactive (03/13 0912)  HBsAg: Negative (01/04 0000)  HIV: Non Reactive (03/13 0912)  GBS: Negative/-- (05/06 1430)  1 hr Glucola 146 Genetic screening  LR, neg Anatomy US FGR, female  Prenatal Transfer Tool  Maternal Diabetes: Yes:  Diabetes Type:  Insulin/Medication controlled Genetic Screening: Normal Maternal Ultrasounds/Referrals: IUGR Fetal Ultrasounds or other Referrals:  None Maternal Substance Abuse:  No Significant Maternal Medications:  None Significant Maternal Lab Results:  Group B Strep negative Number of Prenatal Visits:greater than 3 verified prenatal visits Other Comments:  None  Results for orders placed or performed during the hospital encounter of 12/07/22 (from the past 24 hour(s))  Type and screen   Collection Time: 12/07/22  6:30 AM  Result Value Ref Range   ABO/RH(D) O POS    Antibody Screen NEG    Sample Expiration      12/10/2022,2359 Performed at [facility], [address] [place], [place] [address]   CBC   Collection Time: 12/07/22  6:53 AM  Result Value Ref Range   WBC 7.3 4.0 - 10.5 K/uL   RBC 3.62 (L) 3.87 - 5.11 MIL/uL   Hemoglobin 11.3 (L) 12.0 - 15.0 g/dL   HCT 65.7 (L) 84.6 - 96.2 %   MCV 89.2 80.0 - 100.0 fL   MCH 31.2 26.0 - 34.0 pg   MCHC 35.0 30.0 - 36.0 g/dL   RDW 95.2 84.1 - 32.4 %   Platelets 375 150 - 400 K/uL   nRBC 0.0 0.0 - 0.2 %  Comprehensive metabolic panel   Collection Time: 12/07/22  6:53 AM  Result Value Ref Range   Sodium 134 (L) 135 - 145 mmol/L   Potassium 3.4 (L) 3.5 - 5.1 mmol/L   Chloride 102 98 - 111 mmol/L   CO2 22 22 - 32 mmol/L   Glucose, Bld 95 70 - 99 mg/dL   BUN 5 (L) 6 - 20 mg/dL   Creatinine, Ser 4.01 0.44 - 1.00 mg/dL   Calcium 8.3 (L) 8.9 - 10.3 mg/dL   Total Protein 6.6 6.5 - 8.1 g/dL   Albumin 2.9 (L) 3.5 - 5.0 g/dL   AST 22 15 - 41 U/L   ALT  27 0 - 44 U/L   Alkaline Phosphatase 131 (H) 38 - 126 U/L   Total Bilirubin 0.3 0.3 - 1.2 mg/dL   GFR, Estimated >02 >72 mL/min   Anion gap 10 5 - 15  Glucose, capillary   Collection Time: 12/07/22  7:14 AM  Result Value Ref Range   Glucose-Capillary 92 70 - 99 mg/dL    Patient Active Problem List   Diagnosis Date Noted   IUGR (intrauterine growth restriction) affecting care of mother, third trimester, fetus 1 12/07/2022   GDM (gestational diabetes mellitus) 10/07/2022   Supervision of high risk pregnancy, antepartum 08/15/2022   AMA (advanced maternal age) multigravida 35+ 08/15/2022   Language barrier 08/15/2022   History of low birth weight 08/15/2022   IUGR (intrauterine growth restriction) affecting care of mother 09/07/2020    Assessment/Plan:  Theresa Lambert is a 37 y.o. G5P4004 at [redacted]w[redacted]d here for IOL 2/2 FGR, EFW 4%tile.   #Labor: Initiate with Cytotec  #Pain: Epidural  #FWB: Cat I  #ID: GBS neg #MOF: Both #MOC: BTL?  #Circ: No  Ardella Chhim Danella Deis) Suzie Portela, MSN, CNM  Center for [facility]  12/07/22  9:58 AM

## 2022-12-07 NOTE — Progress Notes (Signed)
Labor Progress Note  Theresa Lambert is a 37 y.o. G5P4004 at [redacted]w[redacted]d presented for IOL FGR/GDM  S: pt doing well just got epidural  O:  BP 108/74   Pulse (!) 59   Temp 98.2 F (36.8 C) (Oral)   Resp 18   Ht 5\' 2"  (1.575 m)   LMP 04/03/2022 (Approximate)   SpO2 99%   BMI 28.02 kg/m  EFM: 140 bpm/Moderate variability/ 15x15 accels/ None decels  CVE: Dilation: 4.5 Effacement (%): 60 Cervical Position: Anterior Station: -2 Presentation: Vertex Exam by:: Mercado MD   A&P: 37 y.o. [id number] [redacted]w[redacted]d  here for IOL as above  #Labor: Progressing well. AROM clear. Pit at 12mu/min #Pain: Family/Friend support and Epidural #FWB: CAT 1 #GBS negative #A2GDM:L last CBG 78, well controlled. CTM  [name], DO FMOB Fellow, Faculty practice [facility], Center for [facility] 12/07/22  9:28 PM

## 2022-12-07 NOTE — Progress Notes (Signed)
Dannyelle Groves is a 37 y.o. G5P4004 at [redacted]w[redacted]d by ultrasound admitted for induction of labor due to Gestational diabetes on metformin and Poor fetal growth. 2022g, 4.1% EFW. AMA   Subjective: Patient doing well. Feeling more contractions and more intensely.   Objective: BP 108/62   Pulse 61   Temp 98 F (36.7 C) (Oral)   Resp 18   Ht 5\' 2"  (1.575 m)   LMP 04/03/2022 (Approximate)   BMI 28.02 kg/m  No intake/output data recorded. No intake/output data recorded.  FHT:  FHR: 145 bpm, variability: moderate,  accelerations:  Present,  decelerations:  Absent UC:   regular, every 3 minutes SVE:   Dilation: 2.5 Effacement (%): 60 Station: Ballotable Exam by:: SPayne,CNM  Labs: Lab Results  Component Value Date   WBC 7.3 12/07/2022   HGB 11.3 (L) 12/07/2022   HCT 32.3 (L) 12/07/2022   MCV 89.2 12/07/2022   PLT 375 12/07/2022    Assessment / Plan: Induction of labor due to IUGR and gestational diabetes s/p cytotec   Labor:  contractions regular every 2-3 mins and mild/moderate to palpation. Contracting too frequently for Pit or Cytotec. Plan to reassess in 2 hours. If contractions space out repeat Cytotec dosage.  Fetal Wellbeing:  Category I Pain Control:  Epidural- planning, Patient may have epidural upon request.  I/D:   GBS negative  Anticipated MOD:  NSVD  Claudette Head, CNM 12/07/2022, 1:29 PM

## 2022-12-07 NOTE — Inpatient Diabetes Management (Signed)
Inpatient Diabetes Program Recommendations  AACE/ADA: New Consensus Statement on Inpatient Glycemic Control (2015)  Target Ranges:  Prepandial:   less than 140 mg/dL      Peak postprandial:   less than 180 mg/dL (1-2 hours)      Critically ill patients:  140 - 180 mg/dL   Lab Results  Component Value Date   GLUCAP 92 12/07/2022    Review of Glycemic Control  Latest Reference Range & Units 12/07/22 07:14  Glucose-Capillary 70 - 99 mg/dL 92   Diabetes history: GDM Outpatient Diabetes medications: Metformin 500 mg BID Current orders for Inpatient glycemic control: CBG's Q4H  Referral received for DM evaluation.  CBG 92 mg/dL this am.    Will continue to follow while inpatient.  Thank you, Dulce Sellar, MSN, CDCES Diabetes Coordinator Inpatient Diabetes Program [phone] (team pager from 8a-5p)

## 2022-12-07 NOTE — Anesthesia Procedure Notes (Signed)
Epidural Patient location during procedure: OB Start time: 12/07/2022 8:22 PM End time: 12/07/2022 8:27 PM  Staffing Anesthesiologist: Linton Rump, MD Performed: anesthesiologist   Preanesthetic Checklist Completed: patient identified, IV checked, site marked, risks and benefits discussed, surgical consent, monitors and equipment checked, pre-op evaluation and timeout performed  Epidural Patient position: sitting Prep: DuraPrep and site prepped and draped Patient monitoring: continuous pulse ox and blood pressure Approach: midline Location: L3-L4 Injection technique: LOR saline  Needle:  Needle type: Tuohy  Needle gauge: 17 G Needle length: 9 cm and 9 Needle insertion depth: 4 cm Catheter type: closed end flexible Catheter size: 19 Gauge Catheter at skin depth: 8 cm Test dose: negative  Assessment Events: blood not aspirated, no cerebrospinal fluid, injection not painful, no injection resistance, no paresthesia and negative IV test  Additional Notes The patient has requested an epidural for labor pain management. Risks and benefits including, but not limited to, infection, bleeding, local anesthetic toxicity, headache, hypotension, back pain, block failure, etc. were discussed with the patient. The patient expressed understanding and consented to the procedure. I confirmed that the patient has no bleeding disorders and is not taking blood thinners. I confirmed the patient's last platelet count with the nurse. A time-out was performed immediately prior to the procedure. Please see nursing documentation for vital signs. Sterile technique was used throughout the whole procedure. Once LOR achieved, the epidural catheter threaded easily without resistance. Aspiration of the catheter was negative for blood and CSF. The epidural was dosed slowly and an infusion was started.  1 attempt(s)Reason for block:procedure for pain

## 2022-12-07 NOTE — Discharge Instructions (Addendum)
Ligadura de trompas con cesrea o despus del parto Con cesrea: $1170 Despus del parto: $1280 ms la anestesia La paciente debe informarle al personal de su deseo de hacerse la ligadura de trompas tan pronto  como le sea posible durante el [place]. Se requiere el pago por adelantado para que el procedimiento pueda programarse. Los planes de pago estn disponibles por solicitud. ** La paciente debe llamar a [facility] Janann August) al [phone] para pedir la cotizacin.

## 2022-12-07 NOTE — Anesthesia Preprocedure Evaluation (Signed)
Anesthesia Evaluation  Patient identified by MRN, date of birth, ID band Patient awake    Reviewed: Allergy & Precautions, NPO status , Patient's Chart, lab work & pertinent test results  History of Anesthesia Complications Negative for: history of anesthetic complications  Airway Mallampati: III  TM Distance: >3 FB Neck ROM: Full    Dental  (+) Dental Advisory Given   Pulmonary neg pulmonary ROS   Pulmonary exam normal breath sounds clear to auscultation       Cardiovascular negative cardio ROS  Rhythm:Regular Rate:Normal     Neuro/Psych  Headaches, neg Seizures    GI/Hepatic negative GI ROS, Neg liver ROS,,,  Endo/Other  diabetes, Gestational, Oral Hypoglycemic Agents    Renal/GU negative Renal ROS     Musculoskeletal   Abdominal   Peds  Hematology negative hematology ROS (+)   Anesthesia Other Findings Takes baby aspirin  Reproductive/Obstetrics (+) Pregnancy                             Anesthesia Physical Anesthesia Plan  ASA: 2  Anesthesia Plan: Epidural   Post-op Pain Management:    Induction:   PONV Risk Score and Plan:   Airway Management Planned:   Additional Equipment:   Intra-op Plan:   Post-operative Plan:   Informed Consent: I have reviewed the patients History and Physical, chart, labs and discussed the procedure including the risks, benefits and alternatives for the proposed anesthesia with the patient or authorized representative who has indicated his/her understanding and acceptance.     Interpreter used for [employer] Discussed with: Anesthesiologist  Anesthesia Plan Comments: (I have discussed risks of neuraxial anesthesia including but not limited to infection, bleeding, nerve injury, back pain, headache, seizures, and failure of block. Patient denies bleeding disorders and is not currently anticoagulated. Labs have been reviewed. Risks and  benefits discussed. All patient's questions answered.  )       Anesthesia Quick Evaluation

## 2022-12-07 NOTE — Progress Notes (Signed)
Labor Progress Note  Nakeeta Weindel is a 37 y.o. G5P4004 at [redacted]w[redacted]d presented for IOL FGR/GDM  S: noted variable and late decels s/p AROM  O:  BP 100/65   Pulse 74   Temp 98.2 F (36.8 C) (Oral)   Resp 18   Ht 5\' 2"  (1.575 m)   LMP 04/03/2022 (Approximate)   SpO2 99%   BMI 28.02 kg/m  EFM: 140 bpm/Moderate variability/ 15x15 accels/ Variable and late decels  CVE: Dilation: 4.5 Effacement (%): 60 Cervical Position: Anterior Station: -2 Presentation: Vertex Exam by:: Mercado MD   A&P: 37 y.o. [id number] [redacted]w[redacted]d  here for IOL as above  #Labor: Progressing well. IVF and maternal position changed. IUPC placed and amnio infusion to start if prior interventions don't help with decels #Pain: Family/Friend support and Epidural #FWB: CAT 2 #GBS negative #A2GDM:L last CBG 78, well controlled. CTM  [name], DO FMOB Fellow, Faculty practice [facility], Center for [facility] 12/07/22  10:22 PM

## 2022-12-08 ENCOUNTER — Encounter (HOSPITAL_COMMUNITY): Payer: Self-pay | Admitting: Obstetrics and Gynecology

## 2022-12-08 LAB — CBC
HCT: 29.2 % — ABNORMAL LOW (ref 36.0–46.0)
Hemoglobin: 10.5 g/dL — ABNORMAL LOW (ref 12.0–15.0)
MCH: 31.2 pg (ref 26.0–34.0)
MCHC: 36 g/dL (ref 30.0–36.0)
MCV: 86.6 fL (ref 80.0–100.0)
Platelets: 337 10*3/uL (ref 150–400)
RBC: 3.37 MIL/uL — ABNORMAL LOW (ref 3.87–5.11)
RDW: 12.4 % (ref 11.5–15.5)
WBC: 11.9 10*3/uL — ABNORMAL HIGH (ref 4.0–10.5)
nRBC: 0 % (ref 0.0–0.2)

## 2022-12-08 LAB — BIRTH TISSUE RECOVERY COLLECTION (PLACENTA DONATION)

## 2022-12-08 MED ORDER — DIBUCAINE (PERIANAL) 1 % EX OINT: 1.0000 | TOPICAL_OINTMENT | CUTANEOUS | Status: DC | PRN

## 2022-12-08 MED ORDER — SIMETHICONE 80 MG PO CHEW: 80.0000 mg | CHEWABLE_TABLET | ORAL | Status: DC | PRN

## 2022-12-08 MED ORDER — SODIUM CHLORIDE 0.9% FLUSH: 3.0000 mL | INTRAVENOUS | Status: DC | PRN

## 2022-12-08 MED ORDER — COCONUT OIL OIL: 1.0000 | TOPICAL_OIL | Status: DC | PRN

## 2022-12-08 MED ORDER — ONDANSETRON HCL 4 MG PO TABS: 4.0000 mg | ORAL_TABLET | ORAL | Status: DC | PRN

## 2022-12-08 MED ORDER — IBUPROFEN 600 MG PO TABS
600.0000 mg | ORAL_TABLET | Freq: Four times a day (QID) | ORAL | Status: DC
  Administered 2022-12-08 – 2022-12-09 (×7): 600 mg via ORAL
  Filled 2022-12-08 (×7): qty 1

## 2022-12-08 MED ORDER — OXYCODONE HCL 5 MG PO TABS: 5.0000 mg | ORAL_TABLET | ORAL | Status: DC | PRN

## 2022-12-08 MED ORDER — ONDANSETRON HCL 4 MG/2ML IJ SOLN: 4.0000 mg | INTRAMUSCULAR | Status: DC | PRN

## 2022-12-08 MED ORDER — ZOLPIDEM TARTRATE 5 MG PO TABS: 5.0000 mg | ORAL_TABLET | Freq: Every evening | ORAL | Status: DC | PRN

## 2022-12-08 MED ORDER — ACETAMINOPHEN 325 MG PO TABS: 650.0000 mg | ORAL_TABLET | ORAL | Status: DC | PRN

## 2022-12-08 MED ORDER — WITCH HAZEL-GLYCERIN EX PADS: 1.0000 | MEDICATED_PAD | CUTANEOUS | Status: DC | PRN

## 2022-12-08 MED ORDER — DIPHENHYDRAMINE HCL 25 MG PO CAPS: 25.0000 mg | ORAL_CAPSULE | Freq: Four times a day (QID) | ORAL | Status: DC | PRN

## 2022-12-08 MED ORDER — ACETAMINOPHEN 325 MG PO TABS
650.0000 mg | ORAL_TABLET | ORAL | Status: DC
  Administered 2022-12-08 – 2022-12-09 (×6): 650 mg via ORAL
  Filled 2022-12-08 (×6): qty 2

## 2022-12-08 MED ORDER — SENNOSIDES-DOCUSATE SODIUM 8.6-50 MG PO TABS
2.0000 | ORAL_TABLET | ORAL | Status: DC
  Administered 2022-12-08 – 2022-12-09 (×2): 2 via ORAL
  Filled 2022-12-08 (×2): qty 2

## 2022-12-08 MED ORDER — SODIUM CHLORIDE 0.9 % IV SOLN: 250.0000 mL | INTRAVENOUS | Status: DC | PRN

## 2022-12-08 MED ORDER — PRENATAL MULTIVITAMIN CH
1.0000 | ORAL_TABLET | Freq: Every day | ORAL | Status: DC
  Administered 2022-12-08 – 2022-12-09 (×2): 1 via ORAL
  Filled 2022-12-08 (×2): qty 1

## 2022-12-08 MED ORDER — BENZOCAINE-MENTHOL 20-0.5 % EX AERO
1.0000 | INHALATION_SPRAY | CUTANEOUS | Status: DC | PRN
  Administered 2022-12-09: 1 via TOPICAL
  Filled 2022-12-08: qty 56

## 2022-12-08 MED ORDER — SODIUM CHLORIDE 0.9% FLUSH: 3.0000 mL | Freq: Two times a day (BID) | INTRAVENOUS | Status: DC

## 2022-12-08 NOTE — Progress Notes (Signed)
POSTPARTUM PROGRESS NOTE  Post Partum Day 1  Subjective:  Theresa Lambert is a 37 y.o. [id number] s/p VD at [redacted]w[redacted]d.  She reports she is doing well. No acute events overnight. She denies any problems with ambulating, voiding or po intake. Denies nausea or vomiting.  Pain is NOT well controlled.  Lochia is adequate.  Objective: Blood pressure (!) 95/56, pulse 66, temperature 98.7 F (37.1 C), temperature source Oral, resp. rate 16, height 5\' 2"  (1.575 m), weight 68.9 kg, last menstrual period 04/03/2022, SpO2 99 %, unknown if currently breastfeeding.  Physical Exam:  General: alert, cooperative and no distress Chest: no respiratory distress Heart:regular rate, distal pulses intact Uterine Fundus: firm, appropriately tender DVT Evaluation: No calf swelling or tenderness Extremities: no edema Skin: warm, dry  Recent Labs    12/07/22 0653 12/08/22 0445  HGB 11.3* 10.5*  HCT 32.3* 29.2*    Assessment/Plan: Faron Carawan is a 37 y.o. [id number] s/p VD at [redacted]w[redacted]d   PPD#1 - Doing well  Routine postpartum care Adjusted pain meds Contraception: nexplanon Feeding: breast/bottle Dispo: Plan for discharge tomorrow.   LOS: 1 day   [name], DO OB Fellow  12/08/2022, 8:08 AM

## 2022-12-08 NOTE — Lactation Note (Signed)
This note was copied from a baby's chart. Lactation Consultation Note  Patient Name: Theresa Lambert ZOXWR'U Date: 12/08/2022 Age:39 hours Reason for consult: Follow-up assessment;Early term 37-38.6wks;Infant < 6lbs  Consult was done in Spanish: LC in to room for initial visit. Birthing parent states infant took 5mL of formula prior to [facility] visit. LC demonstrated hand expression. Reviewed LPI/LBW crib card and recommendations. Encouraged at least 10-12 mL during first 24 HOL. Demonstrated paced bottlefeeding, frequent burping and upright position.  Reinforced skin to skin.   Reviewed DEBP frequency/use/care and milk storage. Collected ~1 mL of expressed breast milk when demonstration hand pump use and fingerfed. Parent is currently using DEBP.   Reviewed ETI/ LBW infant behavior, feeding patterns and expectations with parents.     Plan:   1-Feeding on demand, following crib card recommendations. 2-Preserve infant energy limiting feeding sessions to 30 min max.  3-Hand pump or DEBP every 3h. 4-Encouraged birthing parent hydration, nutrition and rest.   Contact LC as needed for feeds/support/concerns/questions. All questions answered at this time.WIC referral will be sent of behalf of dyad.    Maternal Data Does the patient have breastfeeding experience prior to this delivery?: Yes How long did the patient breastfeed?: 14 months x4  Feeding Mother's Current Feeding Choice: Breast Milk and Formula Nipple Type: Extra Slow Flow  LATCH Score No latch observed during this encounter.   Lactation Tools Discussed/Used Tools: Pump;Flanges;Coconut oil Flange Size: 21 Breast pump type: Manual;Double-Electric Breast Pump Pump Education: Milk Storage;Setup, frequency, and cleaning Reason for Pumping: LPTI and LBW Pumping frequency: every 3h Pumped volume: 1 mL (with manual pump demonstration)  Interventions Interventions: Breast feeding basics reviewed;Skin to skin;Breast massage;Hand  express;Hand pump;Expressed milk;Coconut oil;Education;Pace feeding;LC Services brochure  Discharge Pump: Hands Free WIC Program: Yes  Consult Status Consult Status: Follow-up Date: 12/09/22 Follow-up type: In-patient    Lafaye Mcelmurry A Higuera Ancidey 12/08/2022, 10:10 AM

## 2022-12-08 NOTE — Lactation Note (Signed)
This note was copied from a baby's chart. Lactation Consultation Note  Patient Name: Theresa Lambert ZOXWR'U Date: 12/08/2022 Age:39 hours Reason for consult: Initial assessment;Early term 37-38.6wks;Infant < 6lbs Used [facility] [phone] for consult. Mom is very tired and sleepy. Mom has LPI sheet d/t small baby and Plan of Care card in Spanish and has been reviewed. RN gave baby some formula then baby got tired. RN placed baby STS for injections and resting. After a brief nap after some education reviewed w/interpreter baby swaddled d/t mom very hungry and needed to eat and rest. LC gave baby more formula. Baby chewed on bottle at first, placed baby in side lying position and baby took 7 ml more of formula. FOB held for a little bit then laid baby down in basinet.  W/interpreter LC didn't ask how long or how old her other children were.  Stressed importance of I&O documentation. Encouraged to call for latch assistance. Since baby is so small suggested BF every other feeding and formula feed every feeding. Encouraged mom if she has questions about feedings to ask.  Maternal Data Does the patient have breastfeeding experience prior to this delivery?: Yes  Feeding Nipple Type: Nfant Slow Flow (purple)  LATCH Score Latch: Grasps breast easily, tongue down, lips flanged, rhythmical sucking.  Audible Swallowing: Spontaneous and intermittent  Type of Nipple: Everted at rest and after stimulation  Comfort (Breast/Nipple): Soft / non-tender  Hold (Positioning): No assistance needed to correctly position infant at breast.  LATCH Score: 10   Lactation Tools Discussed/Used Tools: Pump Breast pump type: Double-Electric Breast Pump Reason for Pumping: leass than 6 lbs Pumping frequency: q 3hr  Interventions Interventions: Skin to skin;DEBP;LC Services brochure;Infant Driven Feeding Algorithm education;LPT handout/interventions  Discharge    Consult Status Consult  Status: Follow-up Date: 12/08/22 Follow-up type: In-patient    Theresa Lambert, Theresa Lambert 12/08/2022, 2:07 AM

## 2022-12-08 NOTE — Progress Notes (Signed)
Pt wants BTL, pt is self pay. Pt stated she will contact billing and pay during the day. Spoke to Dr, Camelia Phenes. MD stated to keep pt NPO until after sign out to schedule pt for procedure.

## 2022-12-08 NOTE — Anesthesia Postprocedure Evaluation (Signed)
Anesthesia Post Note  Patient: Theresa Lambert  Procedure(s) Performed: AN AD HOC LABOR EPIDURAL     Patient location during evaluation: Mother Baby Anesthesia Type: Epidural Level of consciousness: awake and alert and oriented Pain management: satisfactory to patient Vital Signs Assessment: post-procedure vital signs reviewed and stable Respiratory status: respiratory function stable Cardiovascular status: stable Postop Assessment: no headache, no backache, epidural receding, patient able to bend at knees, no signs of nausea or vomiting, adequate PO intake and able to ambulate Anesthetic complications: no   No notable events documented.  Last Vitals:  Vitals:   12/08/22 0850 12/08/22 1421  BP: (!) 95/56 96/62  Pulse: 60 62  Resp: 20 19  Temp: 36.7 C 36.7 C  SpO2:  99%    Last Pain:  Vitals:   12/08/22 1421  TempSrc: Oral  PainSc:    Pain Goal:                   Cornelius Marullo

## 2022-12-09 ENCOUNTER — Other Ambulatory Visit (HOSPITAL_COMMUNITY): Payer: Self-pay

## 2022-12-09 MED ORDER — IBUPROFEN 600 MG PO TABS
600.0000 mg | ORAL_TABLET | Freq: Four times a day (QID) | ORAL | 0 refills | Status: DC
  Filled 2022-12-09: qty 30, 8d supply, fill #0

## 2022-12-09 MED ORDER — BENZOCAINE-MENTHOL 20-0.5 % EX AERO
1.0000 | INHALATION_SPRAY | CUTANEOUS | 0 refills | Status: DC | PRN
  Filled 2022-12-09: qty 78, fill #0

## 2022-12-09 MED ORDER — SENNOSIDES-DOCUSATE SODIUM 8.6-50 MG PO TABS
2.0000 | ORAL_TABLET | ORAL | 0 refills | Status: DC
  Filled 2022-12-09: qty 30, 15d supply, fill #0

## 2022-12-09 MED ORDER — ACETAMINOPHEN 325 MG PO TABS
650.0000 mg | ORAL_TABLET | ORAL | 0 refills | Status: DC
  Filled 2022-12-09: qty 30, 3d supply, fill #0

## 2022-12-09 NOTE — Lactation Note (Addendum)
This note was copied from a baby's chart. Lactation Consultation Note  Patient Name: Theresa Lambert ZOXWR'U Date: 12/09/2022 Age:39 hours Reason for consult: Follow-up assessment;Early term 37-38.6wks;Infant < 6lbs;Infant weight loss Consult was done in Spanish:  LC in to room for follow up. Parents state infant has been feeding better and transferred ~64mL formula prior to this visit. Parent explained infant was very active overnight. Parent also states she has been pumping every 3h. Praised parent for efforts. LC revised the importance of supporting infant due to LBW. Infant may be switched to a 24-cal/oz formula.   Plan: 1-Feed (breast and/or formula) no longer than 30 minutes to preserve energy 2-Paced bottlefeeding, per SLP instructions 3-Pump using initiation setting or hand express every 3h 4-Encouraged parents self care 5-Contact LC for support   All questions answered at this time. Parents ar able to teach back feeding plan.   Feeding Mother's Current Feeding Choice: Breast Milk and Formula Nipple Type: Dr. Irving Burton Preemie  LATCH Score No latch observed during this encounter.   Lactation Tools Discussed/Used Tools: Pump;Flanges Breast pump type: Double-Electric Breast Pump;Manual Pumping frequency: encouraged every 3h  Interventions Interventions: Breast feeding basics reviewed;Skin to skin;Breast massage;Hand express;Expressed milk;Hand pump;DEBP;Education  Discharge Discharge Education: Engorgement and breast care;Warning signs for feeding baby  Consult Status Consult Status: Follow-up Date: 12/10/22 Follow-up type: In-patient    Theresa Lambert 12/09/2022, 10:38 AM

## 2022-12-11 ENCOUNTER — Ambulatory Visit (HOSPITAL_COMMUNITY): Payer: Self-pay

## 2022-12-11 NOTE — Lactation Note (Signed)
This note was copied from a baby's chart. Lactation Consultation Note  Patient Name: Theresa Lambert [id number] Date: 12/11/2022 Age:39 days Reason for consult: Follow-up assessment;Early term 52-38.6wks Consult was done in Spanish:  LC in to room for follow up. Parent reports better feedings. Infant took 35 mL of expressed breast milk at 8 AM today. Parents explained infant has had good output, too. Feeding volumes per age are available. Parent is collecting ~95 mL of expressed breast milk. Talked about maintenance pumping. Parents states family has an appointment with WIC on 5/20.  LC reinforced the importance of supporting infant due to LBW. Local resources for support revised during this encounter.   Plan: 1-Feed (breast milk and/or formula) no longer than 30 minutes to preserve energy 2-Paced bottlefeeding with Dr. Theora Gianotti Preemie bottle 3-Pump using initiation setting or hand express after feedings or every 3h 4-Encouraged maternal self care  All questions answered at this time. Family may be discharged today. Parents are excited.    Maternal Data Has patient been taught Hand Expression?: Yes Does the patient have breastfeeding experience prior to this delivery?: Yes  Feeding Mother's Current Feeding Choice: Breast Milk and Formula Nipple Type: Dr. Lorne Skeens  LATCH Score Latch: Grasps breast easily, tongue down, lips flanged, rhythmical sucking.  Audible Swallowing: A few with stimulation  Type of Nipple: Everted at rest and after stimulation  Comfort (Breast/Nipple): Filling, red/small blisters or bruises, mild/mod discomfort  Hold (Positioning): No assistance needed to correctly position infant at breast.  LATCH Score: 8   Lactation Tools Discussed/Used Tools: Pump Breast pump type: Double-Electric Breast Pump;Manual Reason for Pumping: LBW infant Pumping frequency: evrey 3h Pumped volume: 95 mL  Interventions Interventions: Breast feeding basics  reviewed;Skin to skin;Breast massage;Hand express;Expressed milk;Hand pump;DEBP;Education;LC Services brochure;LPT handout/interventions  Discharge Discharge Education: Engorgement and breast care;Warning signs for feeding baby Pump: [facility] Pump  Consult Status Consult Status: Complete Date: 12/11/22 Follow-up type: Call as needed    Theresa Lambert 12/11/2022, 8:59 AM

## 2022-12-15 ENCOUNTER — Telehealth (HOSPITAL_COMMUNITY): Payer: Self-pay

## 2022-12-15 NOTE — Telephone Encounter (Signed)
Patient did not answer phone call. Voicemail left for patient.   Suann Larry [facility]   12/15/22,1812

## 2023-01-16 ENCOUNTER — Ambulatory Visit: Payer: Self-pay | Admitting: Obstetrics and Gynecology

## 2023-01-16 NOTE — Progress Notes (Deleted)
    Post Partum Visit Note  Theresa Lambert is a 37 y.o. [id number] female who presents for a postpartum visit. She is 6 week postpartum following a normal spontaneous vaginal delivery.  I have fully reviewed the prenatal and intrapartum course. The delivery was at 37 gestational weeks.  Anesthesia: epidural. Postpartum course has been ***. Baby is doing well***. Baby is feeding by {breastmilk/bottle:69}. Bleeding {vag bleed:12292}. Bowel function is {normal:32111}. Bladder function is {normal:32111}. Patient {is/is not:9024} sexually active. Contraception method is {contraceptive method:5051}. Postpartum depression screening: {gen negative/positive:315881}.   The pregnancy intention screening data noted above was reviewed. Potential methods of contraception were discussed. The patient elected to proceed with No data recorded.    Health Maintenance Due  Topic Date Due   COVID-19 Vaccine (3 - 2023-24 season) 03/25/2022    {Common ambulatory SmartLinks:19316}  Review of Systems {ros; complete:30496}  Objective:  LMP 04/03/2022 (Approximate)    General:  {gen appearance:16600}   Breasts:  {desc; normal/abnormal/not indicated:14647}  Lungs: {lung exam:16931}  Heart:  {heart exam:5510}  Abdomen: {abdomen exam:16834}   Wound {Wound assessment:11097}  GU exam:  {desc; normal/abnormal/not indicated:14647}       Assessment:    There are no diagnoses linked to this encounter.  *** postpartum exam.   Plan:   Essential components of care per ACOG recommendations:  1.  Mood and well being: Patient with {gen negative/positive:315881} depression screening today. Reviewed local resources for support.  - Patient tobacco use? {tobacco use:25506}  - hx of drug use? {yes/no:25505}    2. Infant care and feeding:  -Patient currently breastmilk feeding? {yes/no:25502}  -Social determinants of health (SDOH) reviewed in EPIC. No concerns***The following needs were identified***  3. Sexuality,  contraception and birth spacing - Patient {DOES_DOES AVW:09811} want a pregnancy in the next year.  Desired family size is {NUMBER 1-10:22536} children.  - Reviewed reproductive life planning. Reviewed contraceptive methods based on pt preferences and effectiveness.  Patient desired {Upstream End Methods:24109} today.   - Discussed birth spacing of 18 months  4. Sleep and fatigue -Encouraged family/partner/community support of 4 hrs of uninterrupted sleep to help with mood and fatigue  5. Physical Recovery  - Discussed patients delivery and complications. She describes her labor as {description:25511} - Patient had a {CHL AMB DELIVERY:254 521 5324}. Patient had a {laceration:25518} laceration. Perineal healing reviewed. Patient expressed understanding - Patient has urinary incontinence? {yes/no:25515} - Patient {ACTION; IS/IS BJY:78295621} safe to resume physical and sexual activity  6.  Health Maintenance - HM due items addressed {Yes or If no, why not?:20788} - Last pap smear No results found for: "DIAGPAP" Pap smear {done:10129} at today's visit.  -Breast Cancer screening indicated? {indicated:25516}  7. Chronic Disease/Pregnancy Condition follow up: {Follow up:25499}  - PCP follow up  Judd Gaudier, CMA [facility], [facility]

## 2023-06-12 ENCOUNTER — Encounter (HOSPITAL_COMMUNITY): Payer: Self-pay

## 2023-06-12 ENCOUNTER — Ambulatory Visit (HOSPITAL_COMMUNITY)
Admission: EM | Admit: 2023-06-12 | Discharge: 2023-06-12 | Disposition: A | Discharge status: Home / Self Care | Payer: Self-pay | Attending: Family Medicine | Admitting: Family Medicine

## 2023-06-12 DIAGNOSIS — G43509 Persistent migraine aura without cerebral infarction, not intractable, without status migrainosus: Secondary | ICD-10-CM

## 2023-06-12 MED ORDER — METOCLOPRAMIDE HCL 5 MG/ML IJ SOLN
5.0000 mg | Freq: Once | INTRAMUSCULAR | Status: AC
  Administered 2023-06-12: 5 mg via INTRAMUSCULAR

## 2023-06-12 MED ORDER — SUMATRIPTAN SUCCINATE 6 MG/0.5ML ~~LOC~~ SOLN
SUBCUTANEOUS | Status: AC
  Filled 2023-06-12: qty 0.5

## 2023-06-12 MED ORDER — METOCLOPRAMIDE HCL 5 MG/ML IJ SOLN
INTRAMUSCULAR | Status: AC
  Filled 2023-06-12: qty 2

## 2023-06-12 MED ORDER — SUMATRIPTAN SUCCINATE 6 MG/0.5ML ~~LOC~~ SOLN
6.0000 mg | Freq: Once | SUBCUTANEOUS | Status: AC
  Administered 2023-06-12: 6 mg via SUBCUTANEOUS

## 2023-06-12 MED ORDER — KETOROLAC TROMETHAMINE 30 MG/ML IJ SOLN
INTRAMUSCULAR | Status: AC
  Filled 2023-06-12: qty 1

## 2023-06-12 MED ORDER — KETOROLAC TROMETHAMINE 30 MG/ML IJ SOLN
30.0000 mg | Freq: Once | INTRAMUSCULAR | Status: AC
  Administered 2023-06-12: 30 mg via INTRAMUSCULAR

## 2023-06-12 NOTE — ED Triage Notes (Signed)
Patient here today with c/o headache X 8 days. She states that she has pain behind her eyes. She has been taking Tylenol and IBU. She states that has also been having pain in her back and arms as well. Her headache keeps her up at night.

## 2023-06-12 NOTE — Discharge Instructions (Signed)
Please follow-up if you feel like you are not getting better despite the injections you received  You may take ibuprofen as needed for any sort of headache.

## 2023-06-12 NOTE — ED Provider Notes (Signed)
[facility]    CSN: [id number] Arrival date & time: 06/12/23  0946      History   Chief Complaint Chief Complaint  Patient presents with   Headache    HPI Theresa Lambert is a 37 y.o. female.   Patient is presenting with a 10-day history of headache.  Patient states that the headache has gotten worse over the past few days and does not go away.  Patient states that she has some pain behind her eyes.  Patient denies any recent sick contacts or cough.  Patient states that she does have photophobia.  Patient never had headaches like this in the past.  Patient notes some blurry vision but no visual field deficits.  Patient denies any weakness in her upper extremities or lower extremities.  Patient otherwise has no other concerns at this time.   Headache   Past Medical History:  Diagnosis Date   Gastritis    Gestational diabetes    Headache     Patient Active Problem List   Diagnosis Date Noted   IUGR (intrauterine growth restriction) affecting care of mother, third trimester, fetus 1 12/07/2022   GDM (gestational diabetes mellitus) 10/07/2022   Supervision of high risk pregnancy, antepartum 08/15/2022   AMA (advanced maternal age) multigravida 35+ 08/15/2022   Language barrier 08/15/2022   History of low birth weight 08/15/2022   Vaginal delivery 10/26/2020   IUGR (intrauterine growth restriction) affecting care of mother 09/07/2020    Past Surgical History:  Procedure Laterality Date   NO PAST SURGERIES      OB History     Gravida  5   Para  5   Term  5   Preterm  0   AB  0   Living  5      SAB  0   IAB  0   Ectopic  0   Multiple  0   Live Births  5            Home Medications    Prior to Admission medications   Medication Sig Start Date End Date Taking? Authorizing Provider  acetaminophen (TYLENOL) 325 MG tablet Take 2 tablets (650 mg total) by mouth every 4 (four) hours. 12/09/22   Mercado-Ortiz, Lahoma Crocker, DO   benzocaine-Menthol (DERMOPLAST) 20-0.5 % AERO Apply 1 Application topically as needed for irritation (perineal discomfort). 12/09/22   Mercado-Ortiz, Lahoma Crocker, DO  ibuprofen (ADVIL) 600 MG tablet Take 1 tablet (600 mg total) by mouth every 6 (six) hours. 12/09/22   Mercado-Ortiz, Lahoma Crocker, DO  senna-docusate (SENOKOT-S) 8.6-50 MG tablet Take 2 tablets by mouth daily. 12/10/22   Mercado-Ortiz, Lahoma Crocker, DO    Family History Family History  Problem Relation Age of Onset   Healthy Mother    Healthy Father    Asthma Son     Social History Social History   Tobacco Use   Smoking status: Never   Smokeless tobacco: Never  Vaping Use   Vaping status: Never Used  Substance Use Topics   Alcohol use: Never   Drug use: Never     Allergies   Patient has no known allergies.   Review of Systems Review of Systems  Neurological:  Positive for headaches.     Physical Exam Triage Vital Signs ED Triage Vitals  Encounter Vitals Group     BP 06/12/23 1200 127/80     Systolic BP Percentile --      Diastolic BP Percentile --  Pulse Rate 06/12/23 1200 61     Resp 06/12/23 1200 16     Temp 06/12/23 1200 98 F (36.7 C)     Temp Source 06/12/23 1200 Oral     SpO2 06/12/23 1200 98 %     Weight 06/12/23 1159 148 lb (67.1 kg)     Height --      Head Circumference --      Peak Flow --      Pain Score 06/12/23 1159 10     Pain Loc --      Pain Education --      Exclude from Growth Chart --    No data found.  Updated Vital Signs BP 127/80 (BP Location: Right Arm)   Pulse 61   Temp 98 F (36.7 C) (Oral)   Resp 16   Wt 67.1 kg   LMP 05/24/2023 (Exact Date)   SpO2 98%   Breastfeeding No   BMI 27.07 kg/m   Visual Acuity Right Eye Distance:   Left Eye Distance:   Bilateral Distance:    Right Eye Near:   Left Eye Near:    Bilateral Near:     Physical Exam Constitutional:      Appearance: She is well-developed.  Eyes:     General: No visual field  deficit. Neurological:     Mental Status: She is alert.     Cranial Nerves: No cranial nerve deficit, dysarthria or facial asymmetry.     Sensory: No sensory deficit.     Motor: No weakness.     Coordination: Romberg sign negative. Coordination normal.     Gait: Gait normal.      UC Treatments / Results  Labs (all labs ordered are listed, but only abnormal results are displayed) Labs Reviewed - No data to display  EKG   Radiology No results found.  Procedures Procedures (including critical care time)  Medications Ordered in UC Medications  ketorolac (TORADOL) 30 MG/ML injection 30 mg (has no administration in time range)  metoCLOPramide (REGLAN) injection 5 mg (has no administration in time range)  SUMAtriptan (IMITREX) injection 6 mg (has no administration in time range)    Initial Impression / Assessment and Plan / UC Course  I have reviewed the triage vital signs and the nursing notes.  Pertinent labs & imaging results that were available during my care of the patient were reviewed by me and considered in my medical decision making (see chart for details).     Patient likely dealing with new onset of a migraine.  Given patient's symptoms as well as the fact of them going on for 10 days, we will go ahead and do a migraine cocktail including Toradol, Reglan and Imitrex.  Patient was advised to follow-up if there is no improvement despite these interventions.  Patient also advised that if her symptoms start to come back she can start doing ibuprofen and try to anticipate the migraine from it getting worse.  Patient understanding and agreeable with plan. Final Clinical Impressions(s) / UC Diagnoses   Final diagnoses:  Persistent migraine aura without cerebral infarction and without status migrainosus, not intractable     Discharge Instructions      Please follow-up if you feel like you are not getting better despite the injections you received  You may take  ibuprofen as needed for any sort of headache.     ED Prescriptions   None    PDMP not reviewed this encounter.   Brenton Grills, MD  06/12/23 1244  

## 2023-09-11 ENCOUNTER — Ambulatory Visit (HOSPITAL_COMMUNITY)
Admission: EM | Admit: 2023-09-11 | Discharge: 2023-09-11 | Disposition: A | Discharge status: Home / Self Care | Payer: Self-pay | Attending: Emergency Medicine | Admitting: Emergency Medicine

## 2023-09-11 ENCOUNTER — Encounter (HOSPITAL_COMMUNITY): Payer: Self-pay

## 2023-09-11 DIAGNOSIS — M549 Dorsalgia, unspecified: Secondary | ICD-10-CM

## 2023-09-11 MED ORDER — CYCLOBENZAPRINE HCL 10 MG PO TABS: 10.0000 mg | ORAL_TABLET | Freq: Two times a day (BID) | ORAL | 0 refills | Status: DC | PRN

## 2023-09-11 MED ORDER — KETOROLAC TROMETHAMINE 30 MG/ML IJ SOLN
30.0000 mg | Freq: Once | INTRAMUSCULAR | Status: AC
  Administered 2023-09-11: 30 mg via INTRAMUSCULAR

## 2023-09-11 MED ORDER — KETOROLAC TROMETHAMINE 30 MG/ML IJ SOLN
INTRAMUSCULAR | Status: AC
  Filled 2023-09-11: qty 1

## 2023-09-11 NOTE — ED Provider Notes (Signed)
 [facility]    CSN: [id number] Arrival date & time: 09/11/23  4098     History   Chief Complaint Chief Complaint  Patient presents with   Back Pain   HPI Theresa Lambert is a 38 y.o. female.  Medical interpreter used for encounter Here with 2-week history of upper back pain. Today rates 10/10 pain Does not radiate into arms or legs. Denies any injury, trauma, or fall. No reported back pain history. Has been using Tylenol.  No medicines yet today  No bladder/bowel dysfunction Denies weakness or paresthesias  Has not noticed rash  Past Medical History:  Diagnosis Date   Gastritis    Gestational diabetes    Headache     Patient Active Problem List   Diagnosis Date Noted   IUGR (intrauterine growth restriction) affecting care of mother, third trimester, fetus 1 12/07/2022   GDM (gestational diabetes mellitus) 10/07/2022   Supervision of high risk pregnancy, antepartum 08/15/2022   AMA (advanced maternal age) multigravida 35+ 08/15/2022   Language barrier 08/15/2022   History of low birth weight 08/15/2022   Vaginal delivery 10/26/2020   IUGR (intrauterine growth restriction) affecting care of mother 09/07/2020    Past Surgical History:  Procedure Laterality Date   NO PAST SURGERIES      OB History     Gravida  5   Para  5   Term  5   Preterm  0   AB  0   Living  5      SAB  0   IAB  0   Ectopic  0   Multiple  0   Live Births  5            Home Medications    Prior to Admission medications   Medication Sig Start Date End Date Taking? Authorizing Provider  cyclobenzaprine (FLEXERIL) 10 MG tablet Take 1 tablet (10 mg total) by mouth 2 (two) times daily as needed for muscle spasms. 09/11/23  Yes Deliana Avalos, Lurena Joiner, PA-C  acetaminophen (TYLENOL) 325 MG tablet Take 2 tablets (650 mg total) by mouth every 4 (four) hours. 12/09/22   Mercado-Ortiz, Lahoma Crocker, DO    Family History Family History  Problem Relation Age of Onset    Healthy Mother    Healthy Father    Asthma Son     Social History Social History   Tobacco Use   Smoking status: Never   Smokeless tobacco: Never  Vaping Use   Vaping status: Never Used  Substance Use Topics   Alcohol use: Never   Drug use: Never     Allergies   Patient has no known allergies.   Review of Systems Review of Systems  Musculoskeletal:  Positive for back pain.   Per HPI  Physical Exam Triage Vital Signs ED Triage Vitals  Encounter Vitals Group     BP 09/11/23 1048 110/62     Systolic BP Percentile --      Diastolic BP Percentile --      Pulse Rate 09/11/23 1048 63     Resp 09/11/23 1048 14     Temp 09/11/23 1048 98.5 F (36.9 C)     Temp Source 09/11/23 1048 Oral     SpO2 09/11/23 1048 98 %     Weight --      Height --      Head Circumference --      Peak Flow --      Pain Score 09/11/23 1054 10  Pain Loc --      Pain Education --      Exclude from Growth Chart --    No data found.  Updated Vital Signs BP 110/62 (BP Location: Right Arm)   Pulse 63   Temp 98.5 F (36.9 C) (Oral)   Resp 14   LMP 08/23/2023 (Approximate)   SpO2 98%   Physical Exam Vitals and nursing note reviewed.  Constitutional:      General: She is not in acute distress.    Appearance: She is not ill-appearing.  HENT:     Mouth/Throat:     Mouth: Mucous membranes are moist.     Pharynx: Oropharynx is clear.  Eyes:     Extraocular Movements: Extraocular movements intact.     Conjunctiva/sclera: Conjunctivae normal.     Pupils: Pupils are equal, round, and reactive to light.  Cardiovascular:     Rate and Rhythm: Normal rate and regular rhythm.     Heart sounds: Normal heart sounds.  Pulmonary:     Effort: Pulmonary effort is normal.     Breath sounds: Normal breath sounds.  Musculoskeletal:        General: Normal range of motion.     Cervical back: Normal range of motion. No rigidity.     Thoracic back: Tenderness present.     Comments: Muscular upper  back tenderness. No bony tenderness C-L spine. Full ROM of neck and extremities. No rash or bruising noted  Skin:    General: Skin is warm and dry.  Neurological:     General: No focal deficit present.     Mental Status: She is alert and oriented to person, place, and time.     Cranial Nerves: Cranial nerves 2-12 are intact. No cranial nerve deficit.     Sensory: Sensation is intact.     Motor: Motor function is intact. No weakness.     Coordination: Coordination is intact.     Gait: Gait is intact.     Deep Tendon Reflexes: Reflexes are normal and symmetric.     Comments: Strength 5/5. Sensation intact throughout      UC Treatments / Results  Labs (all labs ordered are listed, but only abnormal results are displayed) Labs Reviewed - No data to display  EKG  Radiology No results found.  Procedures Procedures   Medications Ordered in UC Medications  ketorolac (TORADOL) 30 MG/ML injection 30 mg (30 mg Intramuscular Given 09/11/23 1126)    Initial Impression / Assessment and Plan / UC Course  I have reviewed the triage vital signs and the nursing notes.  Pertinent labs & imaging results that were available during my care of the patient were reviewed by me and considered in my medical decision making (see chart for details).  Stable vitals, well-appearing, no red flags  Muscular tenderness of the upper back.  No bony tenderness, no trauma, there is no indication for imaging at this time.  Toradol IM given in clinic, muscle relaxer at home, other symptomatic and supportive care.  Advised contact primary care to make appointment for follow-up.  Return and ED precaution discussed.  Patient agrees plan, no questions  Final Clinical Impressions(s) / UC Diagnoses   Final diagnoses:  Upper back pain     Discharge Instructions      The Toradol injection given today should start to work in about 30 minutes. Please do not use any NSAIDs (ibuprofen/Advil, naproxen/Aleve, etc) for  the next 12 hours. You can safely use tylenol.  You can take the Flexeril (muscle relaxer) twice daily. If the medication makes you drowsy, take only at bed time. Please contact your primary care provider for follow up!  La inyeccin de Toradol administrada hoy debera empezar a funcionar en unos 30 minutos. No utilice ningn AINE (ibuprofeno/Advil, naproxeno/Aleve, etc.) durante las prximas 12 horas. Puede utilizar Tylenol de forma segura.   Puede tomar Flexeril (relajante muscular) dos veces al da. Si el medicamento le produce somnolencia, tmelo slo antes de acostarse.  Comunquese con su proveedor de atencin primaria para Education officer, environmental un seguimiento!     ED Prescriptions     Medication Sig Dispense Auth. Provider   cyclobenzaprine (FLEXERIL) 10 MG tablet Take 1 tablet (10 mg total) by mouth 2 (two) times daily as needed for muscle spasms. 20 tablet [place] Jakubiak, Lurena Joiner, PA-C      PDMP not reviewed this encounter.   Marlow Baars, [place] 09/11/23 1154

## 2023-09-11 NOTE — ED Triage Notes (Signed)
 Per Interpreter- Alfonse Ras [phone]  Patient c/o upper back pain x 2 weeks. Patient denies any injury or heavy lifting. Patient states the pain radiates into her arms.   Patient states she has been taking Tylenol and the last dose was last night.

## 2023-09-11 NOTE — Discharge Instructions (Addendum)
 The Toradol injection given today should start to work in about 30 minutes. Please do not use any NSAIDs (ibuprofen/Advil, naproxen/Aleve, etc) for the next 12 hours. You can safely use tylenol.  You can take the Flexeril (muscle relaxer) twice daily. If the medication makes you drowsy, take only at bed time. Please contact your primary care provider for follow up!  La inyeccin de Toradol administrada hoy debera empezar a funcionar en unos 30 minutos. No utilice ningn AINE (ibuprofeno/Advil, naproxeno/Aleve, etc.) durante las prximas 12 horas. Puede utilizar Tylenol de forma segura.   Puede tomar Flexeril (relajante muscular) dos veces al da. Si el medicamento le produce somnolencia, tmelo slo antes de acostarse.  Comunquese con su proveedor de atencin primaria para Education officer, environmental un seguimiento!

## 2024-05-07 ENCOUNTER — Emergency Department (HOSPITAL_COMMUNITY): Payer: Self-pay

## 2024-05-07 ENCOUNTER — Encounter (HOSPITAL_COMMUNITY): Payer: Self-pay

## 2024-05-07 ENCOUNTER — Emergency Department (HOSPITAL_COMMUNITY)
Admission: EM | Admit: 2024-05-07 | Discharge: 2024-05-07 | Disposition: A | Discharge status: Home / Self Care | Payer: Self-pay | Source: Ambulatory Visit | Attending: Emergency Medicine | Admitting: Emergency Medicine

## 2024-05-07 ENCOUNTER — Ambulatory Visit (HOSPITAL_COMMUNITY)
Admission: EM | Admit: 2024-05-07 | Discharge: 2024-05-07 | Disposition: A | Discharge status: Home / Self Care | Payer: Self-pay

## 2024-05-07 ENCOUNTER — Other Ambulatory Visit: Payer: Self-pay

## 2024-05-07 DIAGNOSIS — R519 Headache, unspecified: Secondary | ICD-10-CM

## 2024-05-07 DIAGNOSIS — R42 Dizziness and giddiness: Secondary | ICD-10-CM | POA: Insufficient documentation

## 2024-05-07 LAB — COMPREHENSIVE METABOLIC PANEL WITH GFR
ALT: 17 U/L (ref 0–44)
AST: 23 U/L (ref 15–41)
Albumin: 3.9 g/dL (ref 3.5–5.0)
Alkaline Phosphatase: 82 U/L (ref 38–126)
Anion gap: 8 (ref 5–15)
BUN: 7 mg/dL (ref 6–20)
CO2: 24 mmol/L (ref 22–32)
Calcium: 8.3 mg/dL — ABNORMAL LOW (ref 8.9–10.3)
Chloride: 105 mmol/L (ref 98–111)
Creatinine, Ser: 0.69 mg/dL (ref 0.44–1.00)
GFR, Estimated: >60 mL/min (ref >60)
Glucose, Bld: 99 mg/dL (ref 70–99)
Potassium: 3.5 mmol/L (ref 3.5–5.1)
Sodium: 137 mmol/L (ref 135–145)
Total Bilirubin: 0.6 mg/dL (ref 0.0–1.2)
Total Protein: 6.9 g/dL (ref 6.5–8.1)

## 2024-05-07 LAB — CBC WITH DIFFERENTIAL/PLATELET
Abs Immature Granulocytes: 0.01 K/uL (ref 0.00–0.07)
Basophils Absolute: 0 K/uL (ref 0.0–0.1)
Basophils Relative: 1 %
Eosinophils Absolute: 0.1 K/uL (ref 0.0–0.5)
Eosinophils Relative: 2 %
HCT: 29.3 % — ABNORMAL LOW (ref 36.0–46.0)
Hemoglobin: 9.3 g/dL — ABNORMAL LOW (ref 12.0–15.0)
Immature Granulocytes: 0 %
Lymphocytes Relative: 41 %
Lymphs Abs: 1.5 K/uL (ref 0.7–4.0)
MCH: 23.4 pg — ABNORMAL LOW (ref 26.0–34.0)
MCHC: 31.7 g/dL (ref 30.0–36.0)
MCV: 73.6 fL — ABNORMAL LOW (ref 80.0–100.0)
Monocytes Absolute: 0.3 K/uL (ref 0.1–1.0)
Monocytes Relative: 8 %
Neutro Abs: 1.8 K/uL (ref 1.7–7.7)
Neutrophils Relative %: 48 %
Platelets: 311 K/uL (ref 150–400)
RBC: 3.98 MIL/uL (ref 3.87–5.11)
RDW: 16 % — ABNORMAL HIGH (ref 11.5–15.5)
WBC: 3.7 K/uL — ABNORMAL LOW (ref 4.0–10.5)
nRBC: 0 % (ref 0.0–0.2)

## 2024-05-07 LAB — URINALYSIS, W/ REFLEX TO CULTURE (INFECTION SUSPECTED)
Bilirubin Urine: NEGATIVE
Glucose, UA: NEGATIVE mg/dL
Ketones, ur: NEGATIVE mg/dL
Leukocytes,Ua: NEGATIVE
Nitrite: NEGATIVE
Protein, ur: NEGATIVE mg/dL
Specific Gravity, Urine: 1.005 (ref 1.005–1.030)
pH: 7 (ref 5.0–8.0)

## 2024-05-07 LAB — HCG, SERUM, QUALITATIVE: Preg, Serum: NEGATIVE

## 2024-05-07 MED ORDER — DEXAMETHASONE 4 MG PO TABS
10.0000 mg | ORAL_TABLET | Freq: Once | ORAL | Status: AC
  Administered 2024-05-07: 10 mg via ORAL
  Filled 2024-05-07: qty 3

## 2024-05-07 MED ORDER — KETOROLAC TROMETHAMINE 15 MG/ML IJ SOLN
15.0000 mg | Freq: Once | INTRAMUSCULAR | Status: AC
  Administered 2024-05-07: 15 mg via INTRAMUSCULAR
  Filled 2024-05-07: qty 1

## 2024-05-07 MED ORDER — DIPHENHYDRAMINE HCL 50 MG/ML IJ SOLN
25.0000 mg | Freq: Once | INTRAMUSCULAR | Status: AC
  Administered 2024-05-07: 25 mg via INTRAMUSCULAR
  Filled 2024-05-07: qty 1

## 2024-05-07 MED ORDER — PROCHLORPERAZINE EDISYLATE 10 MG/2ML IJ SOLN
10.0000 mg | Freq: Once | INTRAMUSCULAR | Status: AC
  Administered 2024-05-07: 10 mg via INTRAMUSCULAR
  Filled 2024-05-07: qty 2

## 2024-05-07 MED ORDER — IBUPROFEN 400 MG PO TABS
400.0000 mg | ORAL_TABLET | Freq: Once | ORAL | Status: AC | PRN
  Administered 2024-05-07: 400 mg via ORAL
  Filled 2024-05-07: qty 1

## 2024-05-07 NOTE — ED Provider Triage Note (Signed)
 Emergency Medicine Provider Triage Evaluation Note  Theresa Lambert , a 38 y.o. female  was evaluated in triage.  Pt complains of headache.   Review of Systems  Positive: Headache, lightheadedness Negative: Nausea, vomiting  Physical Exam  BP 120/82 (BP Location: Right Arm)   Pulse 65   Temp 98.4 F (36.9 C)   Resp (!) 22   Ht 5' 2 (1.575 m)   Wt 68 kg   LMP 04/22/2024 (Approximate)   SpO2 100%   BMI 27.44 kg/m  Gen:   Awake, no distress   Resp:  Normal effort  MSK:   Moves extremities without difficulty     Medical Decision Making  Medically screening exam initiated at 11:56 AM.  Appropriate orders placed.  Dahlia Nifong was informed that the remainder of the evaluation will be completed by another provider, this initial triage assessment does not replace that evaluation, and the importance of remaining in the ED until their evaluation is complete.     Gennaro Duwaine CROME, DO 05/07/24 1157

## 2024-05-07 NOTE — ED Provider Notes (Signed)
 [facility] AT [facility] Provider Note   CSN: [id number] Arrival date & time: 05/07/24  1106     Patient presents with: Headache   Theresa Lambert is a 38 y.o. female.   38 yo F with a chief complaint of a headache.  Going on for about a month.  She tells me she has actually had headaches for some time now.  Getting worse over time.  Diffusely about the head but at 1 point today it was worse on the left than the right side she felt a little bit dizzy and so decided to come here for evaluation.  She denies one-sided numbness or weakness or difficulty speech or swallowing.  Denies trauma.  Denies cough congestion or fever.  A language interpreter was used.  Headache      Prior to Admission medications   Medication Sig Start Date End Date Taking? Authorizing Provider  acetaminophen  (TYLENOL ) 325 MG tablet Take 2 tablets (650 mg total) by mouth every 4 (four) hours. 12/09/22   Mercado-Ortiz, Harlene RODES, DO  cyclobenzaprine  (FLEXERIL ) 10 MG tablet Take 1 tablet (10 mg total) by mouth 2 (two) times daily as needed for muscle spasms. 09/11/23   Rising, Asberry, PA-C    Allergies: Patient has no known allergies.    Review of Systems  Neurological:  Positive for headaches.    Updated Vital Signs BP 103/61   Pulse 72   Temp 98.5 F (36.9 C)   Resp 15   Ht 5' 2 (1.575 m)   Wt 68 kg   LMP 04/22/2024 (Approximate)   SpO2 99%   BMI 27.44 kg/m   Physical Exam Vitals and nursing note reviewed.  Constitutional:      General: She is not in acute distress.    Appearance: She is well-developed. She is not diaphoretic.  HENT:     Head: Normocephalic and atraumatic.  Eyes:     Pupils: Pupils are equal, round, and reactive to light.  Cardiovascular:     Rate and Rhythm: Normal rate and regular rhythm.     Heart sounds: No murmur heard.    No friction rub. No gallop.  Pulmonary:     Effort: Pulmonary effort is normal.     Breath sounds: No wheezing  or rales.  Abdominal:     General: There is no distension.     Palpations: Abdomen is soft.     Tenderness: There is no abdominal tenderness.  Musculoskeletal:        General: No tenderness.     Cervical back: Normal range of motion and neck supple.     Comments: Pain at the trapezius bilaterally with some spasm.  Pain at the attachment to the occiput.  Skin:    General: Skin is warm and dry.  Neurological:     Mental Status: She is alert and oriented to person, place, and time.     Cranial Nerves: Cranial nerves 2-12 are intact.     Sensory: Sensation is intact.     Motor: Motor function is intact.     Coordination: Coordination is intact.     Comments: Benign neuro exam  Psychiatric:        Behavior: Behavior normal.     (all labs ordered are listed, but only abnormal results are displayed) Labs Reviewed  COMPREHENSIVE METABOLIC PANEL WITH GFR - Abnormal; Notable for the following components:      Result Value   Calcium 8.3 (*)    All  other components within normal limits  CBC WITH DIFFERENTIAL/PLATELET - Abnormal; Notable for the following components:   WBC 3.7 (*)    Hemoglobin 9.3 (*)    HCT 29.3 (*)    MCV 73.6 (*)    MCH 23.4 (*)    RDW 16.0 (*)    All other components within normal limits  URINALYSIS, W/ REFLEX TO CULTURE (INFECTION SUSPECTED) - Abnormal; Notable for the following components:   Color, Urine STRAW (*)    Hgb urine dipstick SMALL (*)    Bacteria, UA RARE (*)    All other components within normal limits  HCG, SERUM, QUALITATIVE  I-STAT CHEM 8, ED    EKG: None  Radiology: CT Head Wo Contrast Result Date: 05/07/2024 EXAM: CT HEAD WITHOUT CONTRAST 05/07/2024 12:40:00 PM TECHNIQUE: CT of the head was performed without the administration of intravenous contrast. Automated exposure control, iterative reconstruction, and/or weight based adjustment of the mA/kV was utilized to reduce the radiation dose to as low as reasonably achievable. COMPARISON:  CT of the head dated 01/08/2018. CLINICAL HISTORY: headache. FINDINGS: BRAIN AND VENTRICLES: No acute hemorrhage. No evidence of acute infarct. No hydrocephalus. No extra-axial collection. No mass effect or midline shift. ORBITS: No acute abnormality. SINUSES: No acute abnormality. SOFT TISSUES AND SKULL: No acute soft tissue abnormality. No skull fracture. IMPRESSION: 1. No acute intracranial abnormality. Electronically signed by: Evalene Coho MD 05/07/2024 01:00 PM EDT RP Workstation: [id number]     Procedures   Medications Ordered in the ED  ketorolac  (TORADOL ) 15 MG/ML injection 15 mg (has no administration in time range)  prochlorperazine  (COMPAZINE ) injection 10 mg (has no administration in time range)  diphenhydrAMINE  (BENADRYL ) injection 25 mg (has no administration in time range)  dexamethasone (DECADRON) tablet 10 mg (has no administration in time range)  ibuprofen  (ADVIL ) tablet 400 mg (400 mg Oral Given 05/07/24 1144)                                    Medical Decision Making Amount and/or Complexity of Data Reviewed Labs: ordered.  Risk Prescription drug management.   38 yo F with a chief complaints of headache.  This been going on for at least a month.  She tells me she has had headaches much longer than that.  By history and exam it seems more likely to be a tension headache than anything else.  She was seen in the MSE process and had CT imaging performed.  This is negative for acute process.  No acute anemia no significant electrolyte abnormalities.  Benign neurologic exam.  Will treat with a headache cocktail.  Neurology follow-up.  7:17 PM:  I have discussed the diagnosis/risks/treatment options with the patient and family.  Evaluation and diagnostic testing in the emergency department does not suggest an emergent condition requiring admission or immediate intervention beyond what has been performed at this time.  They will follow up with PCP, neuro. We also  discussed returning to the ED immediately if new or worsening sx occur. We discussed the sx which are most concerning (e.g., sudden worsening pain, fever, inability to tolerate by mouth) that necessitate immediate return. Medications administered to the patient during their visit and any new prescriptions provided to the patient are listed below.  Medications given during this visit Medications  ketorolac  (TORADOL ) 15 MG/ML injection 15 mg (has no administration in time range)  prochlorperazine  (COMPAZINE ) injection 10 mg (has no  administration in time range)  diphenhydrAMINE  (BENADRYL ) injection 25 mg (has no administration in time range)  dexamethasone (DECADRON) tablet 10 mg (has no administration in time range)  ibuprofen  (ADVIL ) tablet 400 mg (400 mg Oral Given 05/07/24 1144)     The patient appears reasonably screen and/or stabilized for discharge and I doubt any other medical condition or other [facility] requiring further screening, evaluation, or treatment in the ED at this time prior to discharge.       Final diagnoses:  Left-sided headache    ED Discharge Orders          Ordered    Ambulatory referral to Neurology       Comments: Headache syndrome   05/07/24 1914               Emil Share, DO 05/07/24 8082

## 2024-05-07 NOTE — Discharge Instructions (Signed)
 Please follow-up with your family doctor and neurologist in clinic.  Take 4 over the counter ibuprofen  tablets 3 times a day or 2 over-the-counter naproxen tablets twice a day for pain. Also take tylenol  1000mg (2 extra strength) four times a day.

## 2024-05-07 NOTE — ED Triage Notes (Signed)
 Pt c/o headache x 1 month, worsening each day per pt. Pt states her head and face hurt. Pt denies injury. Pt denies nausea, vomiting. Pt has dizziness, numbness and tingling in head. Pt denies photophobia, weakness. Pt states she is unable to sleep d/t pain, has taken tylenol  and ibuprofen  w/o relief.

## 2024-05-07 NOTE — ED Provider Notes (Signed)
 [facility]    CSN: [id number] Arrival date & time: 05/07/24  9088      History   Chief Complaint Chief Complaint  Patient presents with   Migraine    HPI Theresa Lambert is a 38 y.o. female.   Patient presents today due to consistent headaches for the past month.  Patient is that she has headaches every single day that are not relieved with the use of ibuprofen  and Tylenol .  Patient denies ever being diagnosed with migraines.  Patient states that she is experiencing pain of the entire left side of head with numbness as well.  Patient states that she is having changes in vision and mild dizziness.  Denies nausea or vomiting.   Migraine    Past Medical History:  Diagnosis Date   Gastritis    Gestational diabetes    Headache     Patient Active Problem List   Diagnosis Date Noted   IUGR (intrauterine growth restriction) affecting care of mother, third trimester, fetus 1 12/07/2022   GDM (gestational diabetes mellitus) 10/07/2022   Supervision of high risk pregnancy, antepartum 08/15/2022   AMA (advanced maternal age) multigravida 35+ 08/15/2022   Language barrier 08/15/2022   History of low birth weight 08/15/2022   Vaginal delivery 10/26/2020   IUGR (intrauterine growth restriction) affecting care of mother 09/07/2020    Past Surgical History:  Procedure Laterality Date   NO PAST SURGERIES      OB History     Gravida  5   Para  5   Term  5   Preterm  0   AB  0   Living  5      SAB  0   IAB  0   Ectopic  0   Multiple  0   Live Births  5            Home Medications    Prior to Admission medications   Medication Sig Start Date End Date Taking? Authorizing Provider  acetaminophen  (TYLENOL ) 325 MG tablet Take 2 tablets (650 mg total) by mouth every 4 (four) hours. 12/09/22  Yes Mercado-Ortiz, Harlene RODES, DO  cyclobenzaprine  (FLEXERIL ) 10 MG tablet Take 1 tablet (10 mg total) by mouth 2 (two) times daily as needed for muscle  spasms. 09/11/23   Rising, Asberry PA-C    Family History Family History  Problem Relation Age of Onset   Healthy Mother    Healthy Father    Asthma Son     Social History Social History   Tobacco Use   Smoking status: Never   Smokeless tobacco: Never  Vaping Use   Vaping status: Never Used  Substance Use Topics   Alcohol use: Never   Drug use: Never     Allergies   Patient has no known allergies.   Review of Systems Review of Systems   Physical Exam Triage Vital Signs ED Triage Vitals  Encounter Vitals Group     BP 05/07/24 1003 109/60     Girls Systolic BP Percentile --      Girls Diastolic BP Percentile --      Boys Systolic BP Percentile --      Boys Diastolic BP Percentile --      Pulse Rate 05/07/24 1003 69     Resp 05/07/24 1003 18     Temp 05/07/24 1003 98.2 F (36.8 C)     Temp Source 05/07/24 1003 Oral     SpO2 05/07/24 1003 98 %  Weight --      Height 05/07/24 1003 5' 2 (1.575 m)     Head Circumference --      Peak Flow --      Pain Score 05/07/24 1001 9     Pain Loc --      Pain Education --      Exclude from Growth Chart --    No data found.  Updated Vital Signs BP 109/60 (BP Location: Right Arm)   Pulse 69   Temp 98.2 F (36.8 C) (Oral)   Resp 18   Ht 5' 2 (1.575 m)   LMP 04/22/2024 (Approximate)   SpO2 98%   Breastfeeding No   BMI 27.07 kg/m   Visual Acuity Right Eye Distance:   Left Eye Distance:   Bilateral Distance:    Right Eye Near:   Left Eye Near:    Bilateral Near:     Physical Exam Vitals and nursing note reviewed.  Constitutional:      General: She is not in acute distress.    Appearance: Normal appearance. She is ill-appearing (looks like she does not feel well). She is not toxic-appearing or diaphoretic.  Eyes:     General: No scleral icterus. Cardiovascular:     Rate and Rhythm: Normal rate and regular rhythm.     Heart sounds: Normal heart sounds.  Pulmonary:     Effort: Pulmonary effort is  normal. No respiratory distress.     Breath sounds: Normal breath sounds. No wheezing or rhonchi.  Skin:    General: Skin is warm.  Neurological:     Mental Status: She is alert and oriented to person, place, and time.  Psychiatric:        Mood and Affect: Mood normal.        Behavior: Behavior normal.      UC Treatments / Results  Labs (all labs ordered are listed, but only abnormal results are displayed) Labs Reviewed - No data to display  EKG   Radiology No results found.  Procedures Procedures (including critical care time)  Medications Ordered in UC Medications - No data to display  Initial Impression / Assessment and Plan / UC Course  I have reviewed the triage vital signs and the nursing notes.  Pertinent labs & imaging results that were available during my care of the patient were reviewed by me and considered in my medical decision making (see chart for details).     Intractable headache-advised patient to report to the ER for further evaluation of consistent headaches for 1 month that are not relieved with medications over-the-counter.  Attempted to refer to ambulatory neurology but no appointment for 1 month. Final Clinical Impressions(s) / UC Diagnoses   Final diagnoses:  Intractable headache, unspecified chronicity pattern, unspecified headache type     Discharge Instructions      Please report to ER for further evaluation of persistent headache for one month    ED Prescriptions   None    PDMP not reviewed this encounter.   Andra Corean BROCKS, PA-C 05/07/24 1048

## 2024-05-07 NOTE — ED Triage Notes (Signed)
 Patient presenting with intense headaches, neck pain, and back pain. Onset a month ago off and on. Denies any falls or injuries. Patient states feels like the left side of the face goes numb when having a bad headache. States nothing like this has happened before.   Tried tylenol  and ibuprofen  with no relief.

## 2024-05-07 NOTE — Discharge Instructions (Addendum)
 Please report to ER for further evaluation of persistent headache for one month

## 2024-08-26 ENCOUNTER — Ambulatory Visit (HOSPITAL_COMMUNITY)
Admission: EM | Admit: 2024-08-26 | Discharge: 2024-08-26 | Disposition: A | Discharge status: Home / Self Care | Payer: Self-pay | Source: Home / Self Care | Attending: Family Medicine | Admitting: Family Medicine

## 2024-08-26 ENCOUNTER — Encounter (HOSPITAL_COMMUNITY): Payer: Self-pay | Admitting: *Deleted

## 2024-08-26 ENCOUNTER — Other Ambulatory Visit: Payer: Self-pay

## 2024-08-26 DIAGNOSIS — M542 Cervicalgia: Secondary | ICD-10-CM

## 2024-08-26 DIAGNOSIS — N926 Irregular menstruation, unspecified: Secondary | ICD-10-CM

## 2024-08-26 LAB — POCT URINE PREGNANCY: Preg Test, Ur: NEGATIVE

## 2024-08-26 MED ORDER — KETOROLAC TROMETHAMINE 30 MG/ML IJ SOLN
INTRAMUSCULAR | Status: AC
  Filled 2024-08-26: qty 1

## 2024-08-26 MED ORDER — TIZANIDINE HCL 4 MG PO TABS: 4.0000 mg | ORAL_TABLET | Freq: Three times a day (TID) | ORAL | 0 refills | Status: AC | PRN

## 2024-08-26 MED ORDER — KETOROLAC TROMETHAMINE 10 MG PO TABS: 10.0000 mg | ORAL_TABLET | Freq: Four times a day (QID) | ORAL | 0 refills | Status: AC | PRN

## 2024-08-26 MED ORDER — KETOROLAC TROMETHAMINE 30 MG/ML IJ SOLN
30.0000 mg | Freq: Once | INTRAMUSCULAR | Status: AC
  Administered 2024-08-26: 30 mg via INTRAMUSCULAR

## 2024-08-26 NOTE — Discharge Instructions (Signed)
 The pregnancy test was negative  You have been given a shot of Toradol  30 mg today.  Ketorolac  10 mg tablets--take 1 tablet every 6 hours as needed for pain.  This is the same medicine that is in the shot we just gave you   Take tizanidine  4 mg--1 every 8 hours as needed for muscle spasms; this medication can cause dizziness and sleepiness  You can go to the website [email or web address] to schedule yourself a new patient appointment with primary care  You should also take a multivitamin with iron over-the-counter once daily for your anemia  (La prueba de embarazo dio negativo.  Hoy le hemos administrado una inyeccin de Toradol  de 30 mg.  Tome comprimidos de Ketorolaco de 10 mg: un comprimido cada 6 horas segn sea necesario para chief technology officer. Este es el mismo medicamento que contiene la inyeccin que le acabamos de building services engineer.  Tome tizanidina de 4 mg: un comprimido cada 8 horas segn sea necesario para los espasmos musculares; este medicamento puede causar mareos y somnolencia.  Puede visitar el sitio web [email or web address] para programar una cita con un mdico de atencin primaria.  Tambin debe tomar un multivitamnico con hierro de venta libre una vez al da para la anemia.)
